# Patient Record
Sex: Female | Born: 1955 | Race: Black or African American | Hispanic: No | Marital: Married | Smoking: Never smoker
Health system: Southern US, Community
[De-identification: ages and names within clinical notes are randomized; demographics above are authoritative.]

## PROBLEM LIST (undated history)

## (undated) DIAGNOSIS — R7303 Prediabetes: Secondary | ICD-10-CM

## (undated) DIAGNOSIS — M1990 Unspecified osteoarthritis, unspecified site: Secondary | ICD-10-CM

## (undated) DIAGNOSIS — I10 Essential (primary) hypertension: Secondary | ICD-10-CM

## (undated) HISTORY — DX: Essential (primary) hypertension: I10

## (undated) HISTORY — DX: Unspecified osteoarthritis, unspecified site: M19.90

---

## 1997-09-30 ENCOUNTER — Other Ambulatory Visit: Admission: RE | Admit: 1997-09-30 | Discharge: 1997-09-30 | Payer: Self-pay

## 1998-04-03 ENCOUNTER — Ambulatory Visit (HOSPITAL_COMMUNITY): Admission: RE | Admit: 1998-04-03 | Discharge: 1998-04-03 | Payer: Self-pay

## 1999-06-05 ENCOUNTER — Other Ambulatory Visit: Admission: RE | Admit: 1999-06-05 | Discharge: 1999-06-05 | Payer: Self-pay | Admitting: Family Medicine

## 2007-07-20 HISTORY — PX: COLONOSCOPY: SHX174

## 2007-07-20 HISTORY — PX: ESOPHAGOGASTRODUODENOSCOPY: SHX1529

## 2007-08-13 ENCOUNTER — Ambulatory Visit: Payer: Self-pay | Admitting: Gastroenterology

## 2007-08-13 DIAGNOSIS — R198 Other specified symptoms and signs involving the digestive system and abdomen: Secondary | ICD-10-CM

## 2007-08-13 DIAGNOSIS — R1031 Right lower quadrant pain: Secondary | ICD-10-CM

## 2007-08-13 DIAGNOSIS — R109 Unspecified abdominal pain: Secondary | ICD-10-CM

## 2007-08-13 DIAGNOSIS — I10 Essential (primary) hypertension: Secondary | ICD-10-CM | POA: Insufficient documentation

## 2007-08-17 ENCOUNTER — Encounter: Payer: Self-pay | Admitting: Gastroenterology

## 2007-08-17 ENCOUNTER — Ambulatory Visit: Payer: Self-pay | Admitting: Gastroenterology

## 2007-08-18 ENCOUNTER — Encounter: Payer: Self-pay | Admitting: Gastroenterology

## 2007-08-19 ENCOUNTER — Encounter (INDEPENDENT_AMBULATORY_CARE_PROVIDER_SITE_OTHER): Payer: Self-pay

## 2007-08-19 ENCOUNTER — Encounter: Payer: Self-pay | Admitting: Gastroenterology

## 2007-08-19 ENCOUNTER — Telehealth (INDEPENDENT_AMBULATORY_CARE_PROVIDER_SITE_OTHER): Payer: Self-pay

## 2010-07-20 ENCOUNTER — Inpatient Hospital Stay (HOSPITAL_COMMUNITY): Payer: BC Managed Care – PPO

## 2010-07-20 ENCOUNTER — Inpatient Hospital Stay (HOSPITAL_COMMUNITY)
Admission: EM | Admit: 2010-07-20 | Discharge: 2010-07-24 | DRG: 277 | Disposition: A | Discharge status: Home Health | Payer: BC Managed Care – PPO | Attending: Internal Medicine | Admitting: Internal Medicine

## 2010-07-20 DIAGNOSIS — R7309 Other abnormal glucose: Secondary | ICD-10-CM | POA: Diagnosis present

## 2010-07-20 DIAGNOSIS — Z6841 Body Mass Index (BMI) 40.0 and over, adult: Secondary | ICD-10-CM

## 2010-07-20 DIAGNOSIS — E876 Hypokalemia: Secondary | ICD-10-CM | POA: Diagnosis present

## 2010-07-20 DIAGNOSIS — L02419 Cutaneous abscess of limb, unspecified: Principal | ICD-10-CM | POA: Diagnosis present

## 2010-07-20 NOTE — H&P (Signed)
Andrea Eaton, OAKEY                 ACCOUNT NO.:  [id number]  MEDICAL RECORD NO.:  [id number]           PATIENT TYPE:  E  LOCATION:  [facility]                          FACILITY:  [facility]  PHYSICIAN:  Wilson Singer, M.D.DATE OF BIRTH:  03-05-1955  DATE OF ADMISSION:  07/20/2010 DATE OF DISCHARGE:  LH                             HISTORY & PHYSICAL   CHIEF COMPLAINT:  Right leg swelling and pain.  HISTORY OF PRESENT ILLNESS:  This is a very pleasant 55 year old African American lady presents with a 3-day history of swelling and pain in the right thigh.  The area on the right thigh became red and angry looking and she went to see her primary care physician who prescribed doxycycline.  Unfortunately, outpatient oral medications has not helped her and last night she had a high fever of 103 degrees Fahrenheit.  She now comes with the above complaints.  The patient is not diabetic.  She does have a family history of diabetes however.  She does not know how she got the skin problem, but there is a Band-Aid on the right mid thigh where she appears to have got some kind of "boil."  PAST MEDICAL HISTORY:  No serious illnesses.  She does have obesity.  MEDICATIONS:  Doxycycline 100 mg b.i.d. as an outpatient.  ALLERGIES:  None.  SOCIAL HISTORY:  The patient has been married for 33 years.  She does not smoke, does not drink alcohol.  She is a retired Psychologist, forensic.  FAMILY HISTORY:  Her mother had diabetes.  REVIEW OF SYSTEMS:  Apart from the symptoms mentioned above, there are no other symptoms referable to all systems reviewed.  PHYSICAL EXAMINATION:  VITAL SIGNS:  Temperature 97.9, blood pressure 91/67, pulse 80, respiratory rate 14, saturation 100% on room air. GENERAL:  She looks systemically well.  She is not toxic.  Despite the low blood pressure, she is not in shock clinically. HEART:  Sounds are present and normal. LUNGS:  Fields are clear. ABDOMEN:  Soft and nontender with  no hepatosplenomegaly.  She does have a rather obese abdomen and it is difficult to feel for any masses. NEUROLOGIC:  She is alert and oriented without any focal neurologic signs. SKIN:  She clearly has right thigh cellulitis and I have demarcated this on her right side.  It encompasses a very large area as her legs are also large.  She weighs 252 pounds.  INVESTIGATIONS:  Lab work taken at the primary care physician's office show a hemoglobin of 12.2, white blood cell count 17.3, platelets 190. Sodium 137, potassium 3.6, bicarbonate 23, glucose 109, BUN 15, creatinine 1.09, total bilirubin 3.2, which is indirect.  Liver enzymes essentially normal.  Also, a wound culture has been taken and there is no growth so far.  PROBLEM LIST: 1. Right leg cellulitis. 2. Morbid obesity. 3. Hyperglycemia.  PLAN: 1. Admit. 2. Intravenous antibiotics. 3. Check hemoglobin A1c as she is clearly high risk for diabetes.  Further recommendations will depend on the patient's hospital progress.     Wilson Singer, M.D.     NCG/MEDQ  D:  07/20/2010  T:  07/20/2010  Job:  [id number]  cc:   [facility]  Electronically Signed by Lilly Cove M.D. on 07/20/2010 05:11:25 PM

## 2010-07-21 ENCOUNTER — Inpatient Hospital Stay (HOSPITAL_COMMUNITY): Payer: BC Managed Care – PPO

## 2010-07-21 LAB — HEMOGLOBIN A1C
Hgb A1c MFr Bld: 5.6 % (ref <5.7)
Mean Plasma Glucose: 114 mg/dL (ref <117)

## 2010-07-21 LAB — DIFFERENTIAL
Basophils Absolute: 0 10*3/uL (ref 0.0–0.1)
Basophils Relative: 0 % (ref 0–1)
Eosinophils Absolute: 0.2 10*3/uL (ref 0.0–0.7)
Eosinophils Relative: 1 % (ref 0–5)
Lymphocytes Relative: 11 % — ABNORMAL LOW (ref 12–46)
Lymphs Abs: 1.4 10*3/uL (ref 0.7–4.0)
Monocytes Absolute: 0.8 10*3/uL (ref 0.1–1.0)
Monocytes Relative: 6 % (ref 3–12)
Neutro Abs: 11.3 10*3/uL — ABNORMAL HIGH (ref 1.7–7.7)
Neutrophils Relative %: 83 % — ABNORMAL HIGH (ref 43–77)

## 2010-07-21 LAB — COMPREHENSIVE METABOLIC PANEL
ALT: 16 U/L (ref 0–35)
Calcium: 9.1 mg/dL (ref 8.4–10.5)
Creatinine, Ser: 0.98 mg/dL (ref 0.4–1.2)
Glucose, Bld: 103 mg/dL — ABNORMAL HIGH (ref 70–99)
Sodium: 138 mEq/L (ref 135–145)
Total Protein: 6 g/dL (ref 6.0–8.3)

## 2010-07-21 LAB — CBC
HCT: 30.2 % — ABNORMAL LOW (ref 36.0–46.0)
MCH: 27.8 pg (ref 26.0–34.0)
MCHC: 34.1 g/dL (ref 30.0–36.0)
RDW: 14 % (ref 11.5–15.5)

## 2010-07-21 NOTE — Group Therapy Note (Signed)
  NAMESTEVI, Andrea Eaton                 ACCOUNT NO.:  [id number]  MEDICAL RECORD NO.:  [id number]           PATIENT TYPE:  I  LOCATION:  A319                          FACILITY:  [facility]  PHYSICIAN:  Wilson Singer, M.D.DATE OF BIRTH:  Jul 05, 1955  DATE OF PROCEDURE:  07/21/2010 DATE OF DISCHARGE:                                PROGRESS NOTE   This lady was admitted yesterday with right thigh cellulitis.  She really has not improved since yesterday despite being on intravenous antibiotics.  She still has pain in that right leg.  PHYSICAL EXAMINATION:  VITAL SIGNS:  Temperature 98.4, blood pressure 101/70, pulse 74, saturation 99% on room air. EXTREMITIES:  Examination of the right leg shows cellulitis unchanged from the demarcated line by black ink that I had made yesterday.  In fact I feel there possibly might be an abscess in the right thigh based on a sensation of a fullness and a mass in the mid right thigh.  INVESTIGATIONS:  Sodium 138, potassium 2.8, bicarbonate 22, BUN 16, creatinine was 0.98, albumin reduced at 2.1.  Hemoglobin 10.3, white blood cell count down to 13.7, platelets 210.  IMPRESSION: 1. Right leg cellulitis. 2. Morbid obesity. 3. Hyperglycemia. 4. Hypokalemia.  PLAN: 1. Replete potassium. 2. Continue antibiotics. 3. We will get a CT scan of the right leg to see if there is an     abscess there which would be amenable for surgical intervention by     incision and drainage.     Wilson Singer, M.D.     NCG/MEDQ  D:  07/21/2010  T:  07/21/2010  Job:  [id number]  Electronically Signed by Lilly Cove M.D. on 07/21/2010 02:16:21 PM

## 2010-07-22 ENCOUNTER — Inpatient Hospital Stay (HOSPITAL_COMMUNITY): Payer: BC Managed Care – PPO

## 2010-07-22 LAB — CBC
HCT: 29.8 % — ABNORMAL LOW (ref 36.0–46.0)
Hemoglobin: 10.2 g/dL — ABNORMAL LOW (ref 12.0–15.0)
RBC: 3.63 MIL/uL — ABNORMAL LOW (ref 3.87–5.11)
WBC: 12.8 10*3/uL — ABNORMAL HIGH (ref 4.0–10.5)

## 2010-07-22 LAB — BASIC METABOLIC PANEL
CO2: 28 mEq/L (ref 19–32)
Chloride: 104 mEq/L (ref 96–112)
GFR calc Af Amer: 57 mL/min — ABNORMAL LOW (ref >60)
Glucose, Bld: 120 mg/dL — ABNORMAL HIGH (ref 70–99)
Potassium: 3.2 mEq/L — ABNORMAL LOW (ref 3.5–5.1)
Sodium: 137 mEq/L (ref 135–145)

## 2010-07-22 LAB — DIFFERENTIAL
Basophils Absolute: 0 10*3/uL (ref 0.0–0.1)
Basophils Relative: 0 % (ref 0–1)
Lymphocytes Relative: 15 % (ref 12–46)
Monocytes Absolute: 0.8 10*3/uL (ref 0.1–1.0)
Neutro Abs: 9.9 10*3/uL — ABNORMAL HIGH (ref 1.7–7.7)
Neutrophils Relative %: 77 % (ref 43–77)

## 2010-07-23 LAB — CBC
HCT: 30.1 % — ABNORMAL LOW (ref 36.0–46.0)
Hemoglobin: 10.1 g/dL — ABNORMAL LOW (ref 12.0–15.0)
MCH: 27.8 pg (ref 26.0–34.0)
MCHC: 33.6 g/dL (ref 30.0–36.0)
RBC: 3.63 MIL/uL — ABNORMAL LOW (ref 3.87–5.11)

## 2010-07-23 LAB — BASIC METABOLIC PANEL
BUN: 19 mg/dL (ref 6–23)
Creatinine, Ser: 1.29 mg/dL — ABNORMAL HIGH (ref 0.4–1.2)
GFR calc non Af Amer: 43 mL/min — ABNORMAL LOW (ref >60)
Potassium: 3.5 mEq/L (ref 3.5–5.1)

## 2010-07-23 LAB — DIFFERENTIAL
Basophils Relative: 1 % (ref 0–1)
Lymphocytes Relative: 17 % (ref 12–46)
Monocytes Absolute: 0.7 10*3/uL (ref 0.1–1.0)
Monocytes Relative: 7 % (ref 3–12)
Neutro Abs: 7.5 10*3/uL (ref 1.7–7.7)
Neutrophils Relative %: 73 % (ref 43–77)

## 2010-07-23 NOTE — Group Therapy Note (Signed)
  [name], Eaton                 ACCOUNT NO.:  [id number]  MEDICAL RECORD NO.:  [id number]  LOCATION:                                 FACILITY:  PHYSICIAN:  Wilson Singer, M.D.DATE OF BIRTH:  20-Nov-1955  DATE OF PROCEDURE:  07/22/2010 DATE OF DISCHARGE:                                PROGRESS NOTE   This lady is improving rather slowly.  Overall, she says she thinks her leg is better and less tender.  A CT scan of the right thigh yesterday was rather inconclusive.  The reason was that this was a suboptimal evaluation due to her morbid obesity and the suggestion was there was soft tissue infiltration compatible with cellulitis or edema but there was no clear abscess identified.  On physical examination today, temperature 98.8, blood pressure 111/73, pulse 77, saturation 99% on room air.  She does look systemically well but the cellulitis persists and the line that I demarcated with a black marker shows that the infection has really not improved.  Furthermore, there is still that fullness of quite a large area which may well be a mass of some sort of an abscess.  That area is somewhat tender also.  On investigations, hemoglobin 10.2, white blood cell coming down to 12.8, platelets 210.  Sodium 137, potassium 3.2, bicarbonate 28, BUN 15, creatinine 1.19, hemoglobin A1c was actually 5.6%, so she is probably glucose intolerant rather than frankly diabetic at this point.  IMPRESSION: 1. Right thigh cellulitis with possible abscess. 2. Morbid obesity. 3. Glucose intolerance. 4. Hypokalemia.  PLAN: 1. Replete potassium. 2. Ultrasound of the right thigh as I clinically still worry about an     abscess here. 3. Continue antibiotics.     Wilson Singer, M.D.     NCG/MEDQ  D:  07/22/2010  T:  07/22/2010  Job:  [id number]  Electronically Signed by Andrea Eaton M.D. on 07/23/2010 12:06:18 PM

## 2010-07-23 NOTE — Group Therapy Note (Signed)
  NAMETEPHANIE, Andrea Eaton                 ACCOUNT NO.:  [id number]  MEDICAL RECORD NO.:  [id number]  LOCATION:  A[facility]                          FACILITY:  [facility]  PHYSICIAN:  Wilson Singer, M.D.DATE OF BIRTH:  January 15, 1956  DATE OF PROCEDURE: DATE OF DISCHARGE:                                PROGRESS NOTE   This lady somewhat feels better, but still continues to have some pain in the right thigh.  She did have an ultrasound of the right thigh yesterday and there is a clear fluid collection which measures 5 x 1.9 x 5.5 cm and this is either a abscess or a possible hematoma.  PHYSICAL EXAMINATION:  VITAL SIGNS:  Temperature 98, blood pressure 101/66, pulse 57, saturation 98% on room air. GENERAL:  She looks systemically well otherwise and examination of the right thigh continues to show the cellulitis which really has not improved from the demarcated line I made as well as the fullness and the abscess that I believe I feel on the right thigh.  INVESTIGATIONS:  Sodium 138, potassium 3.5, bicarbonate 28, BUN 19, creatinine slightly raised at 1.29.  Hemoglobin 10.1, white blood cell count coming down again to 10.2, platelets 241.  IMPRESSION: 1. Right thigh cellulitis and possible abscess. 2. Morbid obesity. 3. Glucose intolerance.  PLAN: 1. Continue intravenous antibiotics. 2. Surgical consultation today.     Wilson Singer, M.D.     NCG/MEDQ  D:  07/23/2010  T:  07/23/2010  Job:  [id number]  Electronically Signed by Lilly Cove M.D. on 07/23/2010 12:06:23 PM

## 2010-07-24 LAB — CBC
HCT: 30.8 % — ABNORMAL LOW (ref 36.0–46.0)
MCH: 27.5 pg (ref 26.0–34.0)
MCHC: 33.1 g/dL (ref 30.0–36.0)
MCV: 83 fL (ref 78.0–100.0)
RDW: 14.8 % (ref 11.5–15.5)

## 2010-07-24 LAB — PROTIME-INR: Prothrombin Time: 15.1 seconds (ref 11.6–15.2)

## 2010-07-24 LAB — DIFFERENTIAL
Basophils Relative: 1 % (ref 0–1)
Eosinophils Absolute: 0.2 10*3/uL (ref 0.0–0.7)
Eosinophils Relative: 2 % (ref 0–5)
Monocytes Absolute: 0.8 10*3/uL (ref 0.1–1.0)
Monocytes Relative: 7 % (ref 3–12)
Neutrophils Relative %: 71 % (ref 43–77)

## 2010-07-24 LAB — COMPREHENSIVE METABOLIC PANEL
BUN: 16 mg/dL (ref 6–23)
Calcium: 9.6 mg/dL (ref 8.4–10.5)
Glucose, Bld: 94 mg/dL (ref 70–99)
Total Protein: 6.2 g/dL (ref 6.0–8.3)

## 2010-07-24 LAB — VANCOMYCIN, TROUGH: Vancomycin Tr: 23.6 ug/mL — ABNORMAL HIGH (ref 10.0–20.0)

## 2010-07-24 NOTE — Discharge Summary (Signed)
Andrea Eaton, MONTINI                 ACCOUNT NO.:  [id number]  MEDICAL RECORD NO.:  [id number]  LOCATION:  A[facility]                          FACILITY:  [facility]  PHYSICIAN:  Wilson Singer, M.D.DATE OF BIRTH:  October 03, 1955  DATE OF ADMISSION:  07/20/2010 DATE OF DISCHARGE:  LH                              DISCHARGE SUMMARY   FINAL DISCHARGE DIAGNOSIS:  Right thigh cellulitis with a draining abscess.  CONDITION ON DISCHARGE:  Stable.  MEDICATIONS ON DISCHARGE: 1. Levaquin 750 mg daily for 2 weeks. 2. Oxycodone 5 mg q.i.d. p.r.n. for pain. 3. Ibuprofen 800 mg every 4 h. p.r.n. 4. Tylenol 500 mg 2 capsules every 4-6 h. 5. Stop taking doxycycline (unable to tolerate).  HISTORY:  This very pleasant 55 year old lady was admitted with swelling of the right leg affecting down to the knee and including the thigh associated with a fever.  Please see initial history and physical examination done by Dr. Lilly Cove.  HOSPITAL PROGRESS:  The patient was admitted and started on intravenous antibiotics with Zosyn and vancomycin.  She underwent CT scan of her right thigh, but unfortunately this was rather inconclusive due to her obese body habitus.  There was a comment made about soft tissue infiltration compatible with cellulitis or edema in the appropriate clinical setting.  Due to this, she then underwent an ultrasound of the same area which confirmed complex area within the anterior right thigh and this was concerning for either abscess or hematoma.  The patient was seen by Dr. Franky Macho surgery who was able to exude pus from this area.  There was a wound entry site from which pus was draining.  He felt that this would be sufficient and that would drain further pus.  He is recommended that she will be on antibiotics for further 2 weeks and that he will review her in his office.  The patient is not technically diabetic, but she is glucose intolerant with a hemoglobin A1c of 5.6% and a  fasting glucose of 120.  Today, she feels somewhat better.  She has not had any fevers for the last 36 hours or so.  There is still some tenderness in the right thigh area.  PHYSICAL EXAMINATION:  Temperature 98.4, blood pressure 116/76, pulse 65 and saturation 99%.  Examination of the right leg shows some redness consistent with the cellulitis, but it seems to be receding from the demarcation line made by a Black marker by myself.  Therefore, I think she is improving.  Otherwise, she is hemodynamically stable and lung fields are clear.  INVESTIGATIONS:  Hemoglobin 10.2, white blood cell count 11.2, platelets 252.  Sodium 140, potassium 3.5, bicarbonate 26, BUN 16, creatinine 1.21, albumin 2.2.  We do not have any culture results present at the moment.  DISPOSITION:  I think the patient is stable to be discharged, although she will need close [facility]up [facility] with [facility] and she will need to take 2 weeks of antibiotics.  She will follow up with Dr. Franky Macho in the next week to make sure that her right leg is improving.     Wilson Singer, M.D.  NCG/MEDQ  D:  07/24/2010  T:  07/24/2010  Job:  [id number]  cc:   [facility]  Electronically Signed by Lilly Cove M.D. on 07/24/2010 01:26:31 PM

## 2012-01-14 ENCOUNTER — Encounter: Payer: Self-pay | Admitting: Family Medicine

## 2012-01-14 ENCOUNTER — Ambulatory Visit (INDEPENDENT_AMBULATORY_CARE_PROVIDER_SITE_OTHER): Payer: BC Managed Care – PPO | Admitting: Family Medicine

## 2012-01-14 ENCOUNTER — Other Ambulatory Visit: Payer: Self-pay | Admitting: Family Medicine

## 2012-01-14 VITALS — BP 136/84 | HR 70 | Resp 18 | Ht 60.75 in | Wt 248.0 lb

## 2012-01-14 DIAGNOSIS — M1990 Unspecified osteoarthritis, unspecified site: Secondary | ICD-10-CM

## 2012-01-14 DIAGNOSIS — G4700 Insomnia, unspecified: Secondary | ICD-10-CM

## 2012-01-14 DIAGNOSIS — Z1239 Encounter for other screening for malignant neoplasm of breast: Secondary | ICD-10-CM

## 2012-01-14 DIAGNOSIS — I10 Essential (primary) hypertension: Secondary | ICD-10-CM

## 2012-01-14 DIAGNOSIS — E669 Obesity, unspecified: Secondary | ICD-10-CM | POA: Insufficient documentation

## 2012-01-14 LAB — COMPREHENSIVE METABOLIC PANEL
AST: 16 U/L (ref 0–37)
Albumin: 4.3 g/dL (ref 3.5–5.2)
BUN: 12 mg/dL (ref 6–23)
Calcium: 9.6 mg/dL (ref 8.4–10.5)
Chloride: 105 mEq/L (ref 96–112)
Glucose, Bld: 67 mg/dL — ABNORMAL LOW (ref 70–99)
Potassium: 4 mEq/L (ref 3.5–5.3)
Sodium: 143 mEq/L (ref 135–145)
Total Protein: 8 g/dL (ref 6.0–8.3)

## 2012-01-14 LAB — TSH: TSH: 2.424 u[IU]/mL (ref 0.350–4.500)

## 2012-01-14 LAB — CBC
HCT: 42.8 % (ref 36.0–46.0)
Hemoglobin: 14.5 g/dL (ref 12.0–15.0)
RDW: 15.4 % (ref 11.5–15.5)
WBC: 4.5 10*3/uL (ref 4.0–10.5)

## 2012-01-14 LAB — LIPID PANEL: HDL: 67 mg/dL (ref >39)

## 2012-01-14 NOTE — Progress Notes (Signed)
  Subjective:    Patient ID: Andrea Eaton, female    DOB: 01/05/1956, 56 y.o.   MRN: [id number]  HPI Patient here to establish care. Previous PCP [facility]- Dr. Cynda Acres- Dr. Lajoyce Corners No medications history reviewed she is overdue for Pap smear and mammogram. She's history of high blood pressure she took her meds herself off her medications. She denies any history of diabetes mellitus coronary artery disease or hyperlipidemia. 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 Lajoyce Corners and had steroid injections into her knees however did not followup. She is adverse to taking medications therefore rarely uses and Aleve. She's a history of colon polyps and is due for colonoscopy per report. She has difficulty sleeping because of previous job but she declines any medications for this No exercise   Review of Systems  GEN- denies fatigue, fever, weight loss,weakness, recent illness HEENT- denies eye drainage, change in vision, nasal discharge, CVS- denies chest pain, palpitations RESP- denies SOB, cough, wheeze ABD- denies N/V, change in stools, abd pain GU- denies dysuria, hematuria, dribbling, incontinence MSK- denies joint pain, muscle aches, injury Neuro- denies headache, dizziness, syncope, seizure activity      Objective:   Physical Exam GEN- NAD, alert and oriented x3, obese  HEENT- PERRL, EOMI, non injected sclera, pink conjunctiva, MMM, oropharynx clear Neck- Supple,  CVS- RRR, no murmur RESP-CTAB EXT-+ pedal edema Pulses- Radial, DP- 2+ Psych-normal affect and Mood       Assessment & Plan:

## 2012-01-14 NOTE — Assessment & Plan Note (Signed)
Discussed need for diet and exercise, will also help her OA Discussed water aerobics

## 2012-01-14 NOTE — Assessment & Plan Note (Signed)
No current medications Motrin blood pressure. She will have fasting labs before next visit. Note our computer system was done at that time she was given hand written prescription

## 2012-01-14 NOTE — Assessment & Plan Note (Signed)
Adverse to meds, trial of topical aspercreme, aleve for severe pain Need for weight loss, activity

## 2012-01-14 NOTE — Assessment & Plan Note (Signed)
Declines meds, discussed possible trial of melatonin, good sleep hygiene

## 2012-01-23 ENCOUNTER — Ambulatory Visit (HOSPITAL_COMMUNITY): Payer: BC Managed Care – PPO

## 2012-01-23 ENCOUNTER — Ambulatory Visit (HOSPITAL_COMMUNITY)
Admission: RE | Admit: 2012-01-23 | Discharge: 2012-01-23 | Disposition: A | Discharge status: Home / Self Care | Payer: BC Managed Care – PPO | Source: Ambulatory Visit | Attending: Family Medicine | Admitting: Family Medicine

## 2012-01-23 DIAGNOSIS — Z1231 Encounter for screening mammogram for malignant neoplasm of breast: Secondary | ICD-10-CM | POA: Insufficient documentation

## 2012-01-23 DIAGNOSIS — Z1239 Encounter for other screening for malignant neoplasm of breast: Secondary | ICD-10-CM

## 2012-03-12 ENCOUNTER — Encounter: Payer: BC Managed Care – PPO | Admitting: Family Medicine

## 2012-03-17 ENCOUNTER — Encounter: Payer: Self-pay | Admitting: Family Medicine

## 2012-03-17 ENCOUNTER — Ambulatory Visit (INDEPENDENT_AMBULATORY_CARE_PROVIDER_SITE_OTHER): Payer: BC Managed Care – PPO | Admitting: Family Medicine

## 2012-03-17 VITALS — BP 126/78 | HR 64 | Resp 18 | Ht 60.75 in | Wt 253.1 lb

## 2012-03-17 DIAGNOSIS — R609 Edema, unspecified: Secondary | ICD-10-CM

## 2012-03-17 DIAGNOSIS — Z23 Encounter for immunization: Secondary | ICD-10-CM

## 2012-03-17 DIAGNOSIS — M1990 Unspecified osteoarthritis, unspecified site: Secondary | ICD-10-CM

## 2012-03-17 DIAGNOSIS — H547 Unspecified visual loss: Secondary | ICD-10-CM

## 2012-03-17 DIAGNOSIS — Z1211 Encounter for screening for malignant neoplasm of colon: Secondary | ICD-10-CM

## 2012-03-17 DIAGNOSIS — I10 Essential (primary) hypertension: Secondary | ICD-10-CM

## 2012-03-17 DIAGNOSIS — R600 Localized edema: Secondary | ICD-10-CM

## 2012-03-17 DIAGNOSIS — Z01419 Encounter for gynecological examination (general) (routine) without abnormal findings: Secondary | ICD-10-CM

## 2012-03-17 MED ORDER — HYDROCHLOROTHIAZIDE 12.5 MG PO CAPS: 12.5000 mg | ORAL_CAPSULE | Freq: Every day | ORAL | Status: DC

## 2012-03-17 NOTE — Progress Notes (Signed)
  Subjective:    Patient ID: Andrea Eaton, female    DOB: Jan 04, 1956, 57 y.o.   MRN: [id number]  HPI Patient here for GYN exam she has no specific concerns. Labs were reviewed at visit She declined flu shot Mammogram within normal limits She continues to get some lower leg swelling occasionally she has a sharp pain that runs up her right shin No vaginal bleeding  She occasionally feels a pain beneath the right side on her stomach  Review of Systems  GEN- denies fatigue, fever, weight loss,weakness, recent illness HEENT- denies eye drainage, change in vision, nasal discharge, CVS- denies chest pain, palpitations RESP- denies SOB, cough, wheeze ABD- denies N/V, change in stools, abd pain GU- denies dysuria, hematuria, dribbling, incontinence MSK- denies joint pain, muscle aches, injury Neuro- denies headache, dizziness, syncope, seizure activity      Objective:   Physical Exam  GEN- NAD, alert and oriented, Neck- supple, no thyromegaly  Breast- normal symmetry, no nipple inversion,no nipple drainage, no nodules or lumps felt Nodes- no axillary nodes CVS-RRR,no murmur RESP-CTAB ABD-NABS,soft,NT,ND  GU- normal external genitalia, vaginal mucosa pink and moist, cervix visualized no growth, no blood form os, thin clear discharge, no CMT, no ovarian masses, uterus normal size- difficult to palpate with habitus FOBT- negative, normal rectal tone Ext- + pitting edema R >L        Assessment & Plan:    GYN exam- PAP Smear done, cultures taken , mammogram UTD, TDAP given

## 2012-03-17 NOTE — Patient Instructions (Addendum)
I recommend eye visit once a year I recommend dental visit every 6 months Goal is to  Exercise 30 minutes 5 days a week We will send a letter with lab results  Work your your weight, eat the same healthy foods as your husband You need to schedule appointment with [facility] or try [employer] [facility] Start low dose water pill F/U 4 months

## 2012-03-17 NOTE — Assessment & Plan Note (Signed)
She has an eye doctor, advised to make f/u appt

## 2012-03-17 NOTE — Assessment & Plan Note (Signed)
OTC meds as needed, reviewed old MRI and history

## 2012-03-17 NOTE — Assessment & Plan Note (Signed)
BP looks good she is getting some leg swelling I will start her on low dose HCTZ

## 2012-03-17 NOTE — Assessment & Plan Note (Signed)
Discussed weight and diet in detail, she does not appear motivated to change

## 2012-03-17 NOTE — Assessment & Plan Note (Signed)
I believe this is due to her morbid obesity, start low dose HCTZ, elevate feet, increase activity

## 2012-03-18 ENCOUNTER — Other Ambulatory Visit (HOSPITAL_COMMUNITY)
Admission: RE | Admit: 2012-03-18 | Discharge: 2012-03-18 | Disposition: A | Discharge status: Home / Self Care | Payer: BC Managed Care – PPO | Source: Ambulatory Visit | Attending: Family Medicine | Admitting: Family Medicine

## 2012-03-18 DIAGNOSIS — Z01419 Encounter for gynecological examination (general) (routine) without abnormal findings: Secondary | ICD-10-CM | POA: Insufficient documentation

## 2012-03-18 DIAGNOSIS — N760 Acute vaginitis: Secondary | ICD-10-CM | POA: Insufficient documentation

## 2012-03-18 LAB — POC HEMOCCULT BLD/STL (OFFICE/1-CARD/DIAGNOSTIC): Fecal Occult Blood, POC: NEGATIVE

## 2012-03-18 NOTE — Addendum Note (Signed)
Addended by: Kandis Fantasia B on: 03/18/2012 10:16 AM   Modules accepted: Orders

## 2012-03-19 MED ORDER — METRONIDAZOLE 500 MG PO TABS: 500.0000 mg | ORAL_TABLET | Freq: Two times a day (BID) | ORAL | Status: AC

## 2012-03-19 NOTE — Addendum Note (Signed)
Addended by: Milinda Antis F on: 03/19/2012 12:09 PM   Modules accepted: Orders

## 2012-07-16 ENCOUNTER — Ambulatory Visit (INDEPENDENT_AMBULATORY_CARE_PROVIDER_SITE_OTHER): Payer: BC Managed Care – PPO | Admitting: Family Medicine

## 2012-07-16 ENCOUNTER — Encounter: Payer: Self-pay | Admitting: Family Medicine

## 2012-07-16 VITALS — BP 122/88 | HR 58 | Resp 15 | Wt 248.0 lb

## 2012-07-16 DIAGNOSIS — I10 Essential (primary) hypertension: Secondary | ICD-10-CM

## 2012-07-16 DIAGNOSIS — R0789 Other chest pain: Secondary | ICD-10-CM

## 2012-07-16 DIAGNOSIS — M1990 Unspecified osteoarthritis, unspecified site: Secondary | ICD-10-CM

## 2012-07-16 NOTE — Progress Notes (Signed)
  Subjective:    Patient ID: Andrea Eaton, female    DOB: 11-26-1955, 57 y.o.   MRN: [id number]  HPI  Patient here to followup chronic medical problems. She's not used the hydrochlorothiazide on a regular basis as she is fearful medications. She continues to have pains on and off in bilateral knees they often make a popping sensation. She denies any recent falls. She very rarely takes an over-the-counter acetaminophen tablet which does help. She also describes having episodes where she gets a strange sensation coming up her throat and chest she denies any chest pain shortness of breath diaphoresis the episodes will happen randomly she thought initially due to stress she has taken antacids and aspirin after the event he cannot remember if these helped. She did have a prolonged episode where she felt like the sensation was crawling up her neck which lasted about 10 minutes. She's never sought any medical care for this. She denies any nausea vomiting or heartburn.  Review of Systems   GEN- denies fatigue, fever, weight loss,weakness, recent illness HEENT- denies eye drainage, change in vision, nasal discharge, CVS- denies chest pain, palpitations RESP- denies SOB, cough, wheeze ABD- denies N/V, change in stools, abd pain GU- denies dysuria, hematuria, dribbling, incontinence MSK- + joint pain, muscle aches, injury Neuro- denies headache, dizziness, syncope, seizure activity      Objective:   Physical Exam GEN- NAD, alert and oriented x3, obese weight loss 5lbs HEENT- PERRL, EOMI, non injected sclera, pink conjunctiva, MMM, oropharynx clear CVS- RRR, no murmur RESP-CTAB ABD-NABS,soft,NT,ND MSK- Bilat knee- no effusion, Fair ROM,+ crepitus, liagements in tact  EXT- trace pedal edema Pulses- Radial, DP- 2+        Assessment & Plan:

## 2012-07-16 NOTE — Patient Instructions (Addendum)
Try the Aspercreme for your joints Continue HCTZ Womens One A day -- Calcium (1000mg ) and Vitamin D (800IU) F/U 6 months - Come Fasting to appointment

## 2012-07-17 NOTE — Assessment & Plan Note (Signed)
Advised Aspercreme or acetaminophen, very adverse to medication use We can also try Voltaren gel if OTC does not work

## 2012-07-17 NOTE — Assessment & Plan Note (Signed)
I can not quite get a grasp on her symptoms, does not sound entirely cardiac, ?GI related as she feels it into her throat, no recent episodes she will try to record her symptoms next time and see if antacid helps Anxiety is also a differential, she is dealing with a lot of stressor at home

## 2012-07-17 NOTE — Assessment & Plan Note (Signed)
Continue to encourage healthy eating and change in diet She has lost a few pounds

## 2012-07-17 NOTE — Assessment & Plan Note (Signed)
BP still stable, she is not using HCTZ on regular basis

## 2012-09-27 ENCOUNTER — Emergency Department (HOSPITAL_COMMUNITY): Payer: BC Managed Care – PPO

## 2012-09-27 ENCOUNTER — Encounter (HOSPITAL_COMMUNITY): Payer: Self-pay | Admitting: *Deleted

## 2012-09-27 ENCOUNTER — Observation Stay (HOSPITAL_COMMUNITY)
Admission: EM | Admit: 2012-09-27 | Discharge: 2012-09-27 | Disposition: A | Discharge status: Home / Self Care | Payer: BC Managed Care – PPO | Attending: Family Medicine | Admitting: Family Medicine

## 2012-09-27 DIAGNOSIS — M1990 Unspecified osteoarthritis, unspecified site: Secondary | ICD-10-CM | POA: Diagnosis present

## 2012-09-27 DIAGNOSIS — I10 Essential (primary) hypertension: Secondary | ICD-10-CM | POA: Insufficient documentation

## 2012-09-27 DIAGNOSIS — E66813 Obesity, class 3: Secondary | ICD-10-CM | POA: Diagnosis present

## 2012-09-27 DIAGNOSIS — R55 Syncope and collapse: Principal | ICD-10-CM | POA: Diagnosis present

## 2012-09-27 DIAGNOSIS — R600 Localized edema: Secondary | ICD-10-CM

## 2012-09-27 DIAGNOSIS — R609 Edema, unspecified: Secondary | ICD-10-CM

## 2012-09-27 LAB — CBC WITH DIFFERENTIAL/PLATELET
Basophils Relative: 0 % (ref 0–1)
Eosinophils Absolute: 0.1 10*3/uL (ref 0.0–0.7)
Lymphs Abs: 1.8 10*3/uL (ref 0.7–4.0)
MCH: 29.3 pg (ref 26.0–34.0)
Neutro Abs: 3.9 10*3/uL (ref 1.7–7.7)
Neutrophils Relative %: 64 % (ref 43–77)
Platelets: 263 10*3/uL (ref 150–400)
RBC: 4.84 MIL/uL (ref 3.87–5.11)
WBC: 6.1 10*3/uL (ref 4.0–10.5)

## 2012-09-27 LAB — COMPREHENSIVE METABOLIC PANEL
Albumin: 3.7 g/dL (ref 3.5–5.2)
BUN: 15 mg/dL (ref 6–23)
Calcium: 9.8 mg/dL (ref 8.4–10.5)
Chloride: 104 mEq/L (ref 96–112)
Creatinine, Ser: 0.86 mg/dL (ref 0.50–1.10)
Total Bilirubin: 0.9 mg/dL (ref 0.3–1.2)

## 2012-09-27 LAB — CBC
Hemoglobin: 13.9 g/dL (ref 12.0–15.0)
RBC: 4.8 MIL/uL (ref 3.87–5.11)
WBC: 6.3 10*3/uL (ref 4.0–10.5)

## 2012-09-27 LAB — TSH: TSH: 2.958 u[IU]/mL (ref 0.350–4.500)

## 2012-09-27 LAB — BASIC METABOLIC PANEL
GFR calc Af Amer: >90 mL/min (ref >90)
GFR calc non Af Amer: >90 mL/min (ref >90)
Glucose, Bld: 85 mg/dL (ref 70–99)
Potassium: 3.3 mEq/L — ABNORMAL LOW (ref 3.5–5.1)
Sodium: 141 mEq/L (ref 135–145)

## 2012-09-27 LAB — TROPONIN I
Troponin I: <0.3 ng/mL (ref <0.30)
Troponin I: <0.3 ng/mL (ref <0.30)

## 2012-09-27 LAB — MAGNESIUM: Magnesium: 1.9 mg/dL (ref 1.5–2.5)

## 2012-09-27 LAB — HEMOGLOBIN A1C: Hgb A1c MFr Bld: 5.6 % (ref <5.7)

## 2012-09-27 MED ORDER — ENOXAPARIN SODIUM 60 MG/0.6ML ~~LOC~~ SOLN: 55.0000 mg | SUBCUTANEOUS | Status: DC

## 2012-09-27 MED ORDER — ACETAMINOPHEN 325 MG PO TABS: 650.0000 mg | ORAL_TABLET | ORAL | Status: DC | PRN

## 2012-09-27 MED ORDER — SODIUM CHLORIDE 0.9 % IJ SOLN: 3.0000 mL | Freq: Two times a day (BID) | INTRAMUSCULAR | Status: DC

## 2012-09-27 MED ORDER — POTASSIUM CHLORIDE CRYS ER 20 MEQ PO TBCR
40.0000 meq | EXTENDED_RELEASE_TABLET | Freq: Once | ORAL | Status: AC
  Administered 2012-09-27: 40 meq via ORAL
  Filled 2012-09-27: qty 2

## 2012-09-27 MED ORDER — BISACODYL 5 MG PO TBEC: 5.0000 mg | DELAYED_RELEASE_TABLET | Freq: Every day | ORAL | Status: DC | PRN

## 2012-09-27 MED ORDER — ONDANSETRON HCL 4 MG/2ML IJ SOLN: 4.0000 mg | INTRAMUSCULAR | Status: DC | PRN

## 2012-09-27 MED ORDER — ONDANSETRON HCL 4 MG/2ML IJ SOLN: 4.0000 mg | Freq: Three times a day (TID) | INTRAMUSCULAR | Status: DC | PRN

## 2012-09-27 MED ORDER — ASPIRIN EC 81 MG PO TBEC
81.0000 mg | DELAYED_RELEASE_TABLET | Freq: Every day | ORAL | Status: DC
  Administered 2012-09-27: 81 mg via ORAL
  Filled 2012-09-27: qty 1

## 2012-09-27 NOTE — ED Provider Notes (Signed)
CSN: [id number]     Arrival date & time 09/27/12  0005 History     First MD Initiated Contact with Patient 09/27/12 0008     Chief Complaint  Patient presents with  . Near Syncope    getting hair done and had loss of consciousness   HPI Andrea Eaton is a 57 y.o. female presenting with syncope. Patient had single syncopal episode this evening approximately 2230 this evening, patient had been sitting down for 5 minutes, said she had dizziness and then had syncopal episode denies nausea, tunnel vision, tingling, numbness.  She didn't have any shortness of breath or chest pain. She has a history of hypertension, no known coronary artery disease, no known arrhythmias. No history of CVA. She's not been sick recently, she denies any dizziness or lightheadedness on changing positions recently, she says she's been eating and drinking normally without vomiting or diarrhea. No abdominal pain.   Past Medical History  Diagnosis Date  . Hypertension   . Arthritis     Dr. Lajoyce Corners   History reviewed. No pertinent past surgical history. Family History  Problem Relation Age of Onset  . Hypertension Mother   . Diabetes Mother   . Hypertension Father   . Heart disease Father   . Diabetes Sister   . Hypertension Sister   . Hyperlipidemia Sister   . Heart disease Sister   . Hypertension Brother   . Diabetes Brother    History  Substance Use Topics  . Smoking status: Never Smoker   . Smokeless tobacco: Not on file  . Alcohol Use: No   OB History   Grav Para Term Preterm Abortions TAB SAB Ect Mult Living                 Review of Systems At least 10pt or greater review of systems completed and are negative except where specified in the HPI.  Allergies  Review of patient's allergies indicates no known allergies.  Home Medications   Current Outpatient Rx  Name  Route  Sig  Dispense  Refill  . hydrochlorothiazide (MICROZIDE) 12.5 MG capsule   Oral   Take 1 capsule (12.5 mg total) by mouth  daily.   30 capsule   3    BP 117/43  Pulse 66  Temp(Src) 97.7 F (36.5 C) (Oral)  Resp 20  Ht 5\' 2"  (1.575 m)  Wt 248 lb (112.492 kg)  BMI 45.35 kg/m2  SpO2 100% Physical Exam  Nursing notes reviewed.  Electronic medical record reviewed. VITAL SIGNS:   Filed Vitals:   09/27/12 0017 09/27/12 0017 09/27/12 0218 09/27/12 0246  BP: 138/83 139/76 117/56 139/84  Pulse: 72 70 76   Temp:    98.3 F (36.8 C)  TempSrc:    Oral  Resp:   20 20  Height:    5\' 2"  (1.575 m)  Weight:      SpO2:   98% 99%   CONSTITUTIONAL: Awake, oriented, appears non-toxic HENT: Atraumatic, normocephalic, oral mucosa pink and moist, airway patent. Nares patent without drainage. External ears normal. EYES: Conjunctiva clear, EOMI, PERRLA NECK: Trachea midline, non-tender, supple CARDIOVASCULAR: Normal heart rate, Normal rhythm, No murmurs, rubs, gallops PULMONARY/CHEST: Clear to auscultation, no rhonchi, wheezes, or rales. Symmetrical breath sounds. Non-tender. ABDOMINAL: Non-distended, soft, non-tender - no rebound or guarding.  BS normal. NEUROLOGIC: Non-focal, moving all four extremities, no gross sensory or motor deficits. EXTREMITIES: No clubbing, cyanosis, or edema SKIN: Warm, Dry, No erythema, No rash  ED Course  Procedures (including critical care time)  Date: 09/27/2012  Rate: 63  Rhythm: normal sinus rhythm  QRS Axis: normal  Intervals: normal  ST/T Wave abnormalities: normal  Conduction Disutrbances: none  Narrative Interpretation: unremarkable prior EKG for comparison  Labs Reviewed  COMPREHENSIVE METABOLIC PANEL - Abnormal; Notable for the following:    GFR calc non Af Amer 74 (*)    GFR calc Af Amer 85 (*)    All other components within normal limits  CBC WITH DIFFERENTIAL  TROPONIN I  MAGNESIUM   Dg Chest 2 View  09/27/2012   *RADIOLOGY REPORT*  Clinical Data: Shortness of breath and weakness; syncope.  CHEST - 2 VIEW  Comparison: Chest radiograph performed 07/20/2010   Findings: The lungs are well-aerated and clear.  There is no evidence of focal opacification, pleural effusion or pneumothorax.  The heart is normal in size; the mediastinal contour is within normal limits.  No acute osseous abnormalities are seen.  IMPRESSION: No acute cardiopulmonary process seen.   Original Report Authenticated By: Tonia Ghent, M.D.   Ct Head Wo Contrast  09/27/2012   *RADIOLOGY REPORT*  Clinical Data: Weakness; shortness of breath and headache. Syncope.  CT HEAD WITHOUT CONTRAST  Technique:  Contiguous axial images were obtained from the base of the skull through the vertex without contrast.  Comparison: None.  Findings: There is no evidence of acute infarction, mass lesion, or intra- or extra-axial hemorrhage on CT.  The posterior fossa, including the cerebellum, brainstem and fourth ventricle, is within normal limits.  The third and lateral ventricles, and basal ganglia are unremarkable in appearance.  The cerebral hemispheres are symmetric in appearance, with normal gray- white differentiation.  No mass effect or midline shift is seen.  There is no evidence of fracture; visualized osseous structures are unremarkable in appearance.  The visualized portions of the orbits are within normal limits.  The paranasal sinuses and mastoid air cells are well-aerated.  No significant soft tissue abnormalities are seen.  IMPRESSION: Unremarkable noncontrast CT of the head.   Original Report Authenticated By: Tonia Ghent, M.D.   1. Syncope   2. Hypertension     MDM  Patient presents with syncope-unremarkable workup, negative CT, patient is not orthostatic.  Situation surrounding syncope is concerning with this patient, no prior history, minimal prodrome, no change in orthostatics. Admit patient for syncope workup. Discussed with Dr. Orvan Falconer for admission to telemetry. Admitted stable condition.  Jones Skene, MD 09/27/12 1610

## 2012-09-27 NOTE — Progress Notes (Signed)
Patient received discharge instructions along with follow up appointments and prescriptions. Patient verbalized understanding of all instructions. Patient was escorted by staff via wheelchair to vehicle. Patient discharged to home in stable condition. 

## 2012-09-27 NOTE — H&P (Signed)
[facility] History and Physical  Andrea Eaton  ZOX:[id number]  DOB: Apr 20, 1955   DOA: 09/27/2012   PCP:   Milinda Antis, MD   Chief Complaint:  syncope   HPI: Andrea Eaton is a 57 y.o. female.   Obese African American lady with no significant past medical is who was having a here done by her daughter, when she suddenly became lightheaded and then blacked out. Her daughter eased her down onto the floor, and her report she was unresponsive for what seemed like about minutes. There was no seizure-like activity and when she came around she was fully oriented and aware of what had happened.  EMS was called and she was brought to the emergency room for further evaluation.  There was no associated chest pains nor palpitations; no previous history of syncope.  No abnormalities were found on physical examination or laboratory evaluation.   Patient reports she is prescribed a diuretic for leg edema for the past 5 , but has not been taking it regularly. This week she has been taking it more than usual on the advice her physician.  She is on a self-directed program for weight loss through dieting and has lost 8 pounds in the past 2 months She denies a history of hypertension Rewiew of Systems:   All systems negative except as marked bold or noted in the HPI;  Constitutional:    malaise, fever and chills. ;  Eyes:   eye pain, redness and discharge. ;  ENMT:   ear pain, hoarseness, nasal congestion, sinus pressure and sore throat. ;  Cardiovascular:    chest pain, palpitations, diaphoresis, dyspnea and peripheral edema.  Respiratory:   cough, hemoptysis, wheezing and stridor. ;  Gastrointestinal:  nausea, vomiting, diarrhea, constipation, abdominal pain, melena, blood in stool, hematemesis, jaundice and rectal bleeding. unusual weight loss..   Genitourinary:    frequency, dysuria, incontinence,flank pain and hematuria; Musculoskeletal:   back pain and neck pain.  swelling and trauma.;   Skin: .  pruritus, rash, abrasions, bruising and skin lesion.; ulcerations Neuro:    headache, lightheadedness and neck stiffness.  weakness, altered level of consciousness, altered mental status, extremity weakness, burning feet, involuntary movement, seizure   Psych:    anxiety, depression, insomnia, tearfulness, panic attacks, hallucinations, paranoia, suicidal or homicidal ideation    Past Medical History  Diagnosis Date  . Hypertension   . Arthritis     Dr. Lajoyce Corners    History reviewed. No pertinent past surgical history.  Medications:  HOME MEDS: Prior to Admission medications   Medication Sig Start Date End Date Taking? Authorizing Provider  hydrochlorothiazide (MICROZIDE) 12.5 MG capsule Take 1 capsule (12.5 mg total) by mouth daily. 03/17/12 03/17/13 Yes Salley Scarlet, MD     Allergies:  No Known Allergies  Social History:   reports that she has never smoked. She does not have any smokeless tobacco history on file. She reports that she does not drink alcohol or use illicit drugs.  Family History: Family History  Problem Relation Age of Onset  . Hypertension Mother   . Diabetes Mother   . Hypertension Father   . Heart disease Father   . Diabetes Sister   . Hypertension Sister   . Hyperlipidemia Sister   . Heart disease Sister   . Hypertension Brother   . Diabetes Brother      Physical Exam: Filed Vitals:   09/27/12 0009 09/27/12 0017 09/27/12 0017 09/27/12 0017  BP: 117/43 124/70 138/83 139/76  Pulse: 66 74 72 70  Temp: 97.7 F (36.5 C)     TempSrc: Oral     Resp: 20     Height: 5\' 2"  (1.575 m)     Weight: 112.492 kg (248 lb)     SpO2: 100%      Blood pressure 139/76, pulse 70, temperature 97.7 F (36.5 C), temperature source Oral, resp. rate 20, height 5\' 2"  (1.575 m), weight 112.492 kg (248 lb), SpO2 100.00%. Body mass index is 45.35 kg/(m^2).   GEN:  Pleasant  Obese African American lady  lying bed in no acute distress; cooperative with  exam PSYCH:  alert and oriented x4;   neither anxious nor depressed; affect is appropriate. HEENT: Mucous membranes pink and anicteric; PERRLA; EOM intact; no cervical lymphadenopathy nor thyromegaly or carotid bruit; no JVD; Breasts:: Not examined CHEST WALL: No tenderness CHEST: Normal respiration, clear to auscultation bilaterally HEART: Regular rate and rhythm; no murmurs rubs or gallops BACK:  no CVA tenderness ABDOMEN: Obese, soft non-tender; no masses, no organomegaly, normal abdominal bowel sounds; Rectal Exam: Not done EXTREMITIES:  age-appropriate arthropathy of the hands and knees; no edema; no ulcerations. Genitalia: not examined PULSES: 2+ and symmetric SKIN: Normal hydration no rash or ulceration CNS: Cranial nerves 2-12 grossly intact no focal lateralizing neurologic deficit   Labs on Admission:  Basic Metabolic Panel:  Recent Labs Lab 09/27/12 0058  NA 140  K 3.6  CL 104  CO2 26  GLUCOSE 93  BUN 15  CREATININE 0.86  CALCIUM 9.8  MG 1.9   Liver Function Tests:  Recent Labs Lab 09/27/12 0058  AST 17  ALT 14  ALKPHOS 76  BILITOT 0.9  PROT 8.1  ALBUMIN 3.7   No results found for this basename: LIPASE, AMYLASE,  in the last 168 hours No results found for this basename: AMMONIA,  in the last 168 hours CBC:  Recent Labs Lab 09/27/12 0058  WBC 6.1  NEUTROABS 3.9  HGB 14.2  HCT 41.5  MCV 85.7  PLT 263   Cardiac Enzymes:  Recent Labs Lab 09/27/12 0058  TROPONINI <0.30   BNP: No components found with this basename: POCBNP,  D-dimer: No components found with this basename: D-DIMER,  CBG: No results found for this basename: GLUCAP,  in the last 168 hours  Radiological Exams on Admission: Dg Chest 2 View  09/27/2012   *RADIOLOGY REPORT*  Clinical Data: Shortness of breath and weakness; syncope.  CHEST - 2 VIEW  Comparison: Chest radiograph performed 07/20/2010  Findings: The lungs are well-aerated and clear.  There is no evidence of focal  opacification, pleural effusion or pneumothorax.  The heart is normal in size; the mediastinal contour is within normal limits.  No acute osseous abnormalities are seen.  IMPRESSION: No acute cardiopulmonary process seen.   Original Report Authenticated By: Tonia Ghent, M.D.   Ct Head Wo Contrast  09/27/2012   *RADIOLOGY REPORT*  Clinical Data: Weakness; shortness of breath and headache. Syncope.  CT HEAD WITHOUT CONTRAST  Technique:  Contiguous axial images were obtained from the base of the skull through the vertex without contrast.  Comparison: None.  Findings: There is no evidence of acute infarction, mass lesion, or intra- or extra-axial hemorrhage on CT.  The posterior fossa, including the cerebellum, brainstem and fourth ventricle, is within normal limits.  The third and lateral ventricles, and basal ganglia are unremarkable in appearance.  The cerebral hemispheres are symmetric in appearance, with normal gray- white differentiation.  No mass  effect or midline shift is seen.  There is no evidence of fracture; visualized osseous structures are unremarkable in appearance.  The visualized portions of the orbits are within normal limits.  The paranasal sinuses and mastoid air cells are well-aerated.  No significant soft tissue abnormalities are seen.  IMPRESSION: Unremarkable noncontrast CT of the head.   Original Report Authenticated By: Tonia Ghent, M.D.    EKG: Independently reviewed. Normal sinus rhythm; no abnormalities    Assessment/Plan   vasovagal syncope   morbid obesity Benign leg edema   PLAN: Overnight observation on telemetry positive reinforcement for her current weight loss plan recommend compression stockings for leg edema and avoid diuretics especially in summer.  Other plans as per orders.  Code Status: Full code  Family Communication: Plans discuss with patient at bedside Disposition Plan: LikelyHome in the morning    Lotta Frankenfield Nocturnist [facility] Pager [phone]   09/27/2012, 2:09 AM

## 2012-09-27 NOTE — Discharge Summary (Signed)
Physician Discharge Summary  Andrea Eaton ZOX:[id number] DOB: 10-06-1955 DOA: 09/27/2012  PCP: Andrea Antis, MD  Admit date: 09/27/2012 Discharge date: 09/27/2012  Recommendations for Outpatient Follow-up:  1. Followup syncope, see discussion below 2. TSH and hemoglobin A1c pending, followup as an outpatient   Follow-up Information   Follow up with Andrea Antis, MD In 2 weeks.   Contact information:   [address] [place] [address] [phone]      Discharge Diagnoses:  1. Syncope, likely vasovagal  Discharge Condition: Improved Disposition: Home  Diet recommendation: Regular  Filed Weights   09/27/12 0246 09/27/12 0500 09/27/12 0549  Weight: 111.177 kg (245 lb 1.6 oz) 111.177 kg (245 lb 1.6 oz) 112.2 kg (247 lb 5.7 oz)    History of present illness:  57 year old woman who presented to the emergency department with a history of syncope. She was sitting having her hair done when she suddenly became lightheaded and passed out. No seizure activity. No confusion afterwards. Of note she has been taking a diuretic regularly for lower extremity edema.  Hospital Course:  [name] was admitted for observation. She had no recurrent lightheadedness or syncope. Troponins negative, telemetry unremarkable, EKG showed sinus rhythm. Chemistry panel unremarkable. History highly suggestive of vasovagal syncope. She reports she was having her hair done yesterday, got up from one seat and sat in another seat when she became lightheaded and passed out. When she woke up she felt a little weak but otherwise okay. No nausea or vomiting. No focal neurologic deficits. She does report she is taken a diuretic 5 days or at which is not her usual custom. Also her oral intake has been suboptimal. Compression hose have been recommended. If she continues on diuretics for lower extremity edema she was encouraged to consume enough liquids. No further evaluation suggested at this  point.  Consultants: None Procedures: None  Discharge Instructions  Discharge Orders   Future Orders Complete By Expires     Activity as tolerated - No restrictions  As directed     Diet general  As directed     Discharge instructions  As directed     Comments:      Consider compression stockings. If using diuretics be sure to consume enough fluids. Take your time with position changes. Sit or lie down if you feel lightheaded. Call your physician or seek medical attention for recurrent fainting.        Medication List         hydrochlorothiazide 12.5 MG capsule  Commonly known as:  MICROZIDE  Take 1 capsule (12.5 mg total) by mouth daily.       No Known Allergies  The results of significant diagnostics from this hospitalization (including imaging, microbiology, ancillary and laboratory) are listed below for reference.    Significant Diagnostic Studies: Dg Chest 2 View  09/27/2012   *RADIOLOGY REPORT*  Clinical Data: Shortness of breath and weakness; syncope.  CHEST - 2 VIEW  Comparison: Chest radiograph performed 07/20/2010  Findings: The lungs are well-aerated and clear.  There is no evidence of focal opacification, pleural effusion or pneumothorax.  The heart is normal in size; the mediastinal contour is within normal limits.  No acute osseous abnormalities are seen.  IMPRESSION: No acute cardiopulmonary process seen.   Original Report Authenticated By: Tonia Ghent, M.D.   Ct Head Wo Contrast  09/27/2012   *RADIOLOGY REPORT*  Clinical Data: Weakness; shortness of breath and headache. Syncope.  CT HEAD WITHOUT CONTRAST  Technique:  Contiguous axial images were obtained from the base of the skull through the vertex without contrast.  Comparison: None.  Findings: There is no evidence of acute infarction, mass lesion, or intra- or extra-axial hemorrhage on CT.  The posterior fossa, including the cerebellum, brainstem and fourth ventricle, is within normal limits.  The third and  lateral ventricles, and basal ganglia are unremarkable in appearance.  The cerebral hemispheres are symmetric in appearance, with normal gray- white differentiation.  No mass effect or midline shift is seen.  There is no evidence of fracture; visualized osseous structures are unremarkable in appearance.  The visualized portions of the orbits are within normal limits.  The paranasal sinuses and mastoid air cells are well-aerated.  No significant soft tissue abnormalities are seen.  IMPRESSION: Unremarkable noncontrast CT of the head.   Original Report Authenticated By: Tonia Ghent, M.D.    Labs: Basic Metabolic Panel:  Recent Labs Lab 09/27/12 0058 09/27/12 0616  NA 140 141  K 3.6 3.3*  CL 104 104  CO2 26 27  GLUCOSE 93 85  BUN 15 13  CREATININE 0.86 0.76  CALCIUM 9.8 9.7  MG 1.9  --    Liver Function Tests:  Recent Labs Lab 09/27/12 0058  AST 17  ALT 14  ALKPHOS 76  BILITOT 0.9  PROT 8.1  ALBUMIN 3.7   CBC:  Recent Labs Lab 09/27/12 0058 09/27/12 0616  WBC 6.1 6.3  NEUTROABS 3.9  --   HGB 14.2 13.9  HCT 41.5 41.2  MCV 85.7 85.8  PLT 263 246   Cardiac Enzymes:  Recent Labs Lab 09/27/12 0058 09/27/12 0616  TROPONINI <0.30 <0.30    Active Problems:   Morbid obesity   OA (osteoarthritis)   Syncope   Time coordinating discharge: 25 minutes  Signed:  Brendia Sacks, MD [facility] 09/27/2012, 9:03 AM

## 2012-09-27 NOTE — Progress Notes (Signed)
[facility] PROGRESS NOTE  CHERYLANNE ARDELEAN WUJ:[id number] DOB: Feb 13, 1956 DOA: 09/27/2012 PCP: Milinda Antis, MD  Assessment/Plan: 1. Syncope: Likely vasovagal. Recommend compression stockings. If using diuretics be sure to consume enough liquids. Workup including CT of head, telemetry, cardiac enzymes negative. No further evaluation suggested. 2. Lower extremity edema: Treatment as above.   Discharge home  Pending studies:   TSH  Hemoglobin A1c  Code Status: Full code DVT prophylaxis: Lovenox Family Communication: None present Disposition Plan: As above  Brendia Sacks, MD  [facility]  Pager [phone] If 7PM-7AM, please contact night-coverage at [email or web address], password [facility] 09/27/2012, 8:31 AM  LOS: 0 days   Clinical Summary: 57 year old woman who presented to the emergency department with a history of syncope. She was sitting having her hair done when she suddenly became lightheaded and passed out. No seizure activity. No confusion afterwards. Of note she has been taking a diuretic regularly for lower extremity edema.  Consultants:    Procedures:    Antibiotics:    HPI/Subjective: Feels fine this morning. No recurrence of lightheadedness. No recurrent syncope. No complaints. Hopes to go home. She reports she was having her hair done yesterday, got up from one seat and sat in another seat when she became lightheaded and passed out. When she woke up she felt a little weak but otherwise okay. No nausea or vomiting. No focal neurologic deficits. She does report she is taken a diuretic 5 days or at which is not her usual custom. Also her oral intake has been suboptimal.  Objective: Filed Vitals:   09/27/12 0500 09/27/12 0549 09/27/12 0555 09/27/12 0558  BP:  103/69 133/80 106/70  Pulse:  55 63 84  Temp:  97.5 F (36.4 C)    TempSrc:  Oral    Resp:  20    Height:      Weight: 111.177 kg (245 lb 1.6 oz) 112.2 kg (247 lb 5.7 oz)    SpO2:  99%       Intake/Output Summary (Last 24 hours) at 09/27/12 0831 Last data filed at 09/27/12 0500  Gross per 24 hour  Intake    120 ml  Output      0 ml  Net    120 ml     Filed Weights   09/27/12 0246 09/27/12 0500 09/27/12 0549  Weight: 111.177 kg (245 lb 1.6 oz) 111.177 kg (245 lb 1.6 oz) 112.2 kg (247 lb 5.7 oz)    Exam:   Afebrile, vital signs stable.  General: Appears calm and comfortable.  Cardiovascular: Regular rate and rhythm. No murmur, rub, gallop. Trace bilateral lower extremity edema right greater than left.  Telemetry: Sinus rhythm. No arrhythmias.  Respiratory: Clear to auscultation bilaterally. No wheezes, rales, rhonchi. Normal respiratory effort.  Psychiatric: Grossly normal mood and affect. Speech fluent and appropriate.  Data Reviewed:  Potassium 3.3.  Troponin negative.  CBC unremarkable.  CT of head negative.  Chest x-ray unremarkable.  EKG: Normal sinus rhythm. No acute changes.   Scheduled Meds: . aspirin EC  81 mg Oral Daily  . enoxaparin (LOVENOX) injection  55 mg Subcutaneous Q24H  . sodium chloride  3 mL Intravenous Q12H   Continuous Infusions:   Active Problems:   Morbid obesity   OA (osteoarthritis)   Syncope

## 2012-09-27 NOTE — ED Notes (Signed)
Patient states she doesn't feel like herself. Family insisted on her being seen and that she had passed out per ems

## 2012-10-14 ENCOUNTER — Ambulatory Visit (INDEPENDENT_AMBULATORY_CARE_PROVIDER_SITE_OTHER): Payer: BC Managed Care – PPO | Admitting: Family Medicine

## 2012-10-14 VITALS — BP 112/70 | HR 76 | Temp 97.9°F | Resp 20 | Wt 245.0 lb

## 2012-10-14 DIAGNOSIS — J3489 Other specified disorders of nose and nasal sinuses: Secondary | ICD-10-CM

## 2012-10-14 DIAGNOSIS — R0981 Nasal congestion: Secondary | ICD-10-CM

## 2012-10-14 DIAGNOSIS — G5690 Unspecified mononeuropathy of unspecified upper limb: Secondary | ICD-10-CM

## 2012-10-14 DIAGNOSIS — R232 Flushing: Secondary | ICD-10-CM

## 2012-10-14 DIAGNOSIS — G5691 Unspecified mononeuropathy of right upper limb: Secondary | ICD-10-CM

## 2012-10-14 DIAGNOSIS — E876 Hypokalemia: Secondary | ICD-10-CM

## 2012-10-14 DIAGNOSIS — N951 Menopausal and female climacteric states: Secondary | ICD-10-CM

## 2012-10-14 DIAGNOSIS — R55 Syncope and collapse: Secondary | ICD-10-CM

## 2012-10-14 LAB — BASIC METABOLIC PANEL
CO2: 23 mEq/L (ref 19–32)
Chloride: 108 mEq/L (ref 96–112)
Sodium: 142 mEq/L (ref 135–145)

## 2012-10-14 NOTE — Patient Instructions (Addendum)
You can try Estroven for the hot flashes We will call with lab results Call if not improved in 2 weeks  Change F/U to October

## 2012-10-15 ENCOUNTER — Encounter: Payer: Self-pay | Admitting: Family Medicine

## 2012-10-15 DIAGNOSIS — R0981 Nasal congestion: Secondary | ICD-10-CM | POA: Insufficient documentation

## 2012-10-15 DIAGNOSIS — R232 Flushing: Secondary | ICD-10-CM | POA: Insufficient documentation

## 2012-10-15 DIAGNOSIS — E876 Hypokalemia: Secondary | ICD-10-CM | POA: Insufficient documentation

## 2012-10-15 DIAGNOSIS — G5690 Unspecified mononeuropathy of unspecified upper limb: Secondary | ICD-10-CM | POA: Insufficient documentation

## 2012-10-15 NOTE — Assessment & Plan Note (Signed)
Check K level, replaced in hospital

## 2012-10-15 NOTE — Assessment & Plan Note (Signed)
Her symptoms are improving, occurred after blood draw, will give another 2 weeks, if not at baseline send for nerve conduction studies

## 2012-10-15 NOTE — Assessment & Plan Note (Signed)
Post menopausal symptoms Discussed options, she can try Estroven OTC vitamin has some black co hosh

## 2012-10-15 NOTE — Assessment & Plan Note (Signed)
Unchanged, little exercise, also discussed diet

## 2012-10-15 NOTE — Progress Notes (Signed)
  Subjective:    Patient ID: Andrea Eaton, female    DOB: Jul 12, 1955, 57 y.o.   MRN: [id number]  HPI  Pt here to f/u hospital admission for syncope. Discharge note reviewed. Was sitting getting hair done and had syncopal event witnessed by daughter. Had taken HCTZ that week, otherwise no other medications. Cardiac work-up, CT normal, labs showed hypokalemia that was replaced during admission. She did stop HCTZ past week, has not had any other symptoms.  Tingling numbness in right hand and forearm since she had a blood draw during admission. States phlebotomist hit a nerve and since then she has tingling run down arm and numbness in finger tips. Symptoms are improving since she was discharged.  Nasal congestion- has feeling of congestion, denies any URI , sneezing, runny nose, feels like she gets blocked like she is going to have a nosebleed but nothing comes out. Able to breath as normal.  Has had hot flashes and night sweats for past few months, did not think they would return  Review of Systems - per above  GEN- denies fatigue, fever, weight loss,weakness, recent illness HEENT- denies eye drainage, change in vision, nasal discharge, CVS- denies chest pain, palpitations RESP- denies SOB, cough, wheeze ABD- denies N/V, change in stools, abd pain GU- denies dysuria, hematuria, dribbling, incontinence MSK- denies joint pain, muscle aches, injury Neuro- denies headache, dizziness, syncope, seizure activity, +paresthesia hand      Objective:   Physical Exam GEN- NAD, alert and oriented x3 HEENT- PERRL, EOMI, non injected sclera, pink conjunctiva, MMM, oropharynx clear, nares clear, turbinates normal, mild erythema inside right nares, no bleeding noted, no rhinorrhea, no maxillary sinus tenderness, TM clear bilat Neck- Supple, no bruit CVS- RRR, no murmur RESP-CTAB EXT- trace pedal edema Pulses- Radial, DP- 2+ Neuro- CNII-XII in tact, RUE decreased monofilament on tips of all 5 fingers,  gross sensation in tact, + phalen's, +neg tinels,  MSK- FROM UE bilat, normal ROM wrist, elbow right side Skin - in tact, no rash        Assessment & Plan:

## 2012-10-15 NOTE — Assessment & Plan Note (Signed)
Based on evaluation it seems she may have had hypotension with use of her HCTZ Will d/c for now, no further work up needed

## 2012-10-15 NOTE — Assessment & Plan Note (Signed)
Normal exam, no signs of infection

## 2012-12-08 ENCOUNTER — Ambulatory Visit: Payer: BC Managed Care – PPO | Admitting: Family Medicine

## 2013-01-19 ENCOUNTER — Telehealth: Payer: Self-pay | Admitting: Family Medicine

## 2013-01-19 ENCOUNTER — Ambulatory Visit (INDEPENDENT_AMBULATORY_CARE_PROVIDER_SITE_OTHER): Payer: BC Managed Care – PPO | Admitting: Family Medicine

## 2013-01-19 ENCOUNTER — Encounter: Payer: Self-pay | Admitting: Family Medicine

## 2013-01-19 VITALS — BP 170/100 | HR 89 | Temp 97.4°F | Resp 18 | Ht 60.0 in | Wt 249.0 lb

## 2013-01-19 DIAGNOSIS — I10 Essential (primary) hypertension: Secondary | ICD-10-CM

## 2013-01-19 DIAGNOSIS — Z23 Encounter for immunization: Secondary | ICD-10-CM

## 2013-01-19 LAB — CBC
HCT: 39.1 % (ref 36.0–46.0)
Hemoglobin: 13.4 g/dL (ref 12.0–15.0)
RDW: 14.9 % (ref 11.5–15.5)
WBC: 5.3 10*3/uL (ref 4.0–10.5)

## 2013-01-19 MED ORDER — LISINOPRIL-HYDROCHLOROTHIAZIDE 10-12.5 MG PO TABS: 1.0000 | ORAL_TABLET | Freq: Every day | ORAL | Status: DC

## 2013-01-19 NOTE — Telephone Encounter (Signed)
Have her come in at 4:30 pm

## 2013-01-19 NOTE — Telephone Encounter (Signed)
BP are running 184/124  194/134.  Wants to see you today.  No appt available.

## 2013-01-19 NOTE — Progress Notes (Signed)
   Subjective:    Patient ID: Andrea Eaton, female    DOB: 05-21-1955, 57 y.o.   MRN: [id number]  HPI  Patient here secondary to elevated blood pressure. She has a history of hypertension was taken off her medication at the she had a syncopal event back in August. At that time she was not taking the HCTZ 25 mg on a daily basis. She's been feeling a little dizzy headed and has had some leg swelling and fatigue over the past week. She also felt like she was going to have a nosebleed but never had one. She checked her blood pressure at church today and her blood pressure was elevated to 194/134. She denies any chest pain or shortness of breath  Review of Systems   GEN- denies fatigue, fever, weight loss,weakness, recent illness HEENT- denies eye drainage, change in vision, nasal discharge, CVS- denies chest pain, palpitations RESP- denies SOB, cough, wheeze Neuro- +headache, dizziness, syncope, seizure activity      Objective:   Physical Exam GEN- NAD, alert and oriented x3 HEENT- PERRL, EOMI, non injected sclera, pink conjunctiva, MMM, oropharynx clear Neck- Supple,  CVS- RRR, no murmur RESP-CTAB EXT- 1+ edema to pre-tibial region Pulses- Radial, DP- 2+ NEURO-CNII-XII in tact       Assessment & Plan:

## 2013-01-19 NOTE — Patient Instructions (Signed)
Start blood pressure medication- take 1 tonight and 1 in the morning Labs to be done today F/U 4: 15pm tomorrow

## 2013-01-19 NOTE — Assessment & Plan Note (Signed)
Blood pressure is extremely elevated today. I will start her on lisinopril HCTZ to take one tablet tonight and one the morning and that she will followup tomorrow afternoon for blood pressure recheck. We'll also check her metabolic panel and CBC. She's advised to wear her TED hose to help with her swelling as well. Also decrease her salt intake. Unfortunately taking medications on a regular basis has been difficult

## 2013-01-19 NOTE — Telephone Encounter (Signed)
Pt called and told to come in this afternoon per Dr Jeanice Lim

## 2013-01-20 ENCOUNTER — Ambulatory Visit (INDEPENDENT_AMBULATORY_CARE_PROVIDER_SITE_OTHER): Payer: BC Managed Care – PPO | Admitting: Family Medicine

## 2013-01-20 ENCOUNTER — Ambulatory Visit: Payer: BC Managed Care – PPO | Admitting: Family Medicine

## 2013-01-20 VITALS — BP 140/90 | HR 78 | Temp 97.5°F | Resp 18 | Ht 60.0 in | Wt 250.0 lb

## 2013-01-20 DIAGNOSIS — I10 Essential (primary) hypertension: Secondary | ICD-10-CM

## 2013-01-20 LAB — COMPREHENSIVE METABOLIC PANEL
ALT: 11 U/L (ref 0–35)
BUN: 12 mg/dL (ref 6–23)
CO2: 26 mEq/L (ref 19–32)
Calcium: 9 mg/dL (ref 8.4–10.5)
Chloride: 105 mEq/L (ref 96–112)
Creat: 0.73 mg/dL (ref 0.50–1.10)

## 2013-01-20 NOTE — Patient Instructions (Signed)
Continue the lisinopril HCTZ Check Blood pressure at home daily and write down F/U By phone in 1 week  F/U  January as scheduled

## 2013-01-21 ENCOUNTER — Encounter: Payer: Self-pay | Admitting: Family Medicine

## 2013-01-21 NOTE — Assessment & Plan Note (Signed)
Blood pressure is much improved today. I will continue her current medications. Her metabolic panel was within normal limits. Will followup in one week by phone to see how her blood pressures are running.

## 2013-01-21 NOTE — Progress Notes (Signed)
   Subjective:    Patient ID: Andrea Eaton, female    DOB: 1955/04/16, 57 y.o.   MRN: [id number]  HPI  Patient here to followup hypertension. She was seen yesterday secondary to severely elevated blood pressure. She was started on lisinopril HCTZ. She states that she had one bout of diarrhea last night but otherwise is not having difficulty with the medication. Some of her leg swelling is also went down. She denies any headache or dizziness.  Review of Systems - per above  GEN- denies fatigue, fever, weight loss,weakness, recent illness HEENT- denies eye drainage, change in vision, nasal discharge, CVS- denies chest pain, palpitations RESP- denies SOB, cough, wheeze Neuro- denies headache, dizziness, syncope, seizure activity      Objective:   Physical Exam  GEN- NAD, alert and oriented x3 CVS- RRR, no murmur RESP-CTAB EXT- pedal edema       Assessment & Plan:

## 2013-01-22 ENCOUNTER — Telehealth: Payer: Self-pay | Admitting: Family Medicine

## 2013-01-22 NOTE — Telephone Encounter (Signed)
LMTRC

## 2013-01-22 NOTE — Telephone Encounter (Signed)
Pt is calling today because last night her Bp medication made her feel like she was going to pass out  Call back [phone]

## 2013-01-26 NOTE — Telephone Encounter (Signed)
Pt states she is feelign better now

## 2013-03-19 ENCOUNTER — Ambulatory Visit (INDEPENDENT_AMBULATORY_CARE_PROVIDER_SITE_OTHER): Payer: BC Managed Care – PPO | Admitting: Family Medicine

## 2013-03-19 ENCOUNTER — Encounter: Payer: BC Managed Care – PPO | Admitting: Family Medicine

## 2013-03-19 ENCOUNTER — Encounter: Payer: Self-pay | Admitting: Family Medicine

## 2013-03-19 VITALS — BP 130/80 | HR 68 | Temp 97.7°F | Resp 18 | Ht 60.0 in | Wt 245.0 lb

## 2013-03-19 DIAGNOSIS — R202 Paresthesia of skin: Secondary | ICD-10-CM | POA: Insufficient documentation

## 2013-03-19 DIAGNOSIS — R600 Localized edema: Secondary | ICD-10-CM

## 2013-03-19 DIAGNOSIS — I10 Essential (primary) hypertension: Secondary | ICD-10-CM

## 2013-03-19 DIAGNOSIS — Z0000 Encounter for general adult medical examination without abnormal findings: Secondary | ICD-10-CM | POA: Insufficient documentation

## 2013-03-19 DIAGNOSIS — R209 Unspecified disturbances of skin sensation: Secondary | ICD-10-CM

## 2013-03-19 DIAGNOSIS — R609 Edema, unspecified: Secondary | ICD-10-CM

## 2013-03-19 DIAGNOSIS — R55 Syncope and collapse: Secondary | ICD-10-CM | POA: Insufficient documentation

## 2013-03-19 MED ORDER — LISINOPRIL 10 MG PO TABS: 10.0000 mg | ORAL_TABLET | Freq: Every day | ORAL | Status: DC

## 2013-03-19 NOTE — Assessment & Plan Note (Signed)
Change to lisinopril one tablet daily with importance of weight loss as well as diet control

## 2013-03-19 NOTE — Patient Instructions (Signed)
Blood pressure medication has been changed to lisinopril only- take every day Use compression hose  F/U 6 Weeks for blood pressure

## 2013-03-19 NOTE — Assessment & Plan Note (Signed)
Physical done. She will set up her mammogram. Fasting labs done.

## 2013-03-19 NOTE — Assessment & Plan Note (Signed)
For now I will have her use compression toes she was also given a new prescription as well as elevation of the feet appear

## 2013-03-19 NOTE — Assessment & Plan Note (Signed)
Very minimal symptoms I wonder if this is due to some the swelling that she has in her feet she is not diabetic shoe normal A1c back in August I will recheck her blood glucose. At this time we will monitor his symptoms I do not think is severe enough to warrant any medication or nerve conduction study at this time

## 2013-03-19 NOTE — Progress Notes (Signed)
   Subjective:    Patient ID: Andrea Eaton, female    DOB: [birth year], 58 y.o.   MRN: [id number]  HPI  Patient here for complete physical exam. Her Pap smear was normal in January 2014 she's never had any abnormal Pap smears therefore is on 3 years cycle. Her colonoscopy is up to date her immunizations are up-to-date. She is history of hypertension at her last visit she was on lisinopril HCTZ she states since then she had another episode similar to about 6 months ago when she had a syncopal event. She was concern because her prescription said that she should not be under artificial light or sunlight with her blood pressure medication she was sitting in front of a heater and felt faint began in sick similar to how she did 6 months ago. At that time she was on hydrochlorothiazide 25 mg which was discontinued and was thought that she was also not drinking enough. She's not been taking her blood pressure medication for the past week or so since the event. If not had any chest drainage shortness of breath no headaches or dizziness she does get swelling in her ankles and has noted some tingling on her right great toe only for 1 month.   Review of Systems - per above   GEN- denies fatigue, fever, weight loss,weakness, recent illness HEENT- denies eye drainage, change in vision, nasal discharge, CVS- denies chest pain, palpitations RESP- denies SOB, cough, wheeze ABD- denies N/V, change in stools, abd pain GU- denies dysuria, hematuria, dribbling, incontinence MSK- denies joint pain, muscle aches, injury Neuro- denies headache, dizziness, syncope, seizure activity        Objective:   Physical Exam  GEN- NAD, alert and oriented x3 HEENT- PERRL, EOMI, non injected sclera, pink conjunctiva, MMM, oropharynx clear Neck- supple, no thyromegaly, no bruits Breast- normal symmetry, no nipple inversion,no nipple drainage, no nodules or lumps felt Nodes- no axillary nodes CVS- RRR, no  murmur RESP-CTAB ABD-NABS,soft,NT,ND GU-Deferred EXT- pedal edema Pulses- Radial, DP- 2+ Neuro- CNII-XII in tact, normal tone, gross sensation LE in tact, motor equal bilat, Right great toe decreased monofilament Skin- dry skin, callus bilat  EKG-NSR, no ST changes     Assessment & Plan:

## 2013-03-19 NOTE — Assessment & Plan Note (Signed)
She has significant workup while in the hospital before the previous syncopal event the only culprit and dehydration with the hydrochlorothiazide. To have her back on low-dose diuretic I will completely take this out of the picture put her on lisinopril 10 mg. Her repeat EKG was normal her exam is fairly benign there is no neurological deficits she did not actually have a syncopal event just felt faint. Also have her increase her fluid intake

## 2013-03-29 LAB — CBC WITH DIFFERENTIAL/PLATELET
Basophils Absolute: 0 10*3/uL (ref 0.0–0.1)
Basophils Relative: 0 % (ref 0–1)
EOS PCT: 2 % (ref 0–5)
Eosinophils Absolute: 0.1 10*3/uL (ref 0.0–0.7)
HEMATOCRIT: 37.5 % (ref 36.0–46.0)
Hemoglobin: 12.8 g/dL (ref 12.0–15.0)
LYMPHS ABS: 1.7 10*3/uL (ref 0.7–4.0)
LYMPHS PCT: 43 % (ref 12–46)
MCH: 28.4 pg (ref 26.0–34.0)
MCHC: 34.1 g/dL (ref 30.0–36.0)
MCV: 83.1 fL (ref 78.0–100.0)
MONO ABS: 0.3 10*3/uL (ref 0.1–1.0)
Monocytes Relative: 7 % (ref 3–12)
Neutro Abs: 1.9 10*3/uL (ref 1.7–7.7)
Neutrophils Relative %: 48 % (ref 43–77)
Platelets: 241 10*3/uL (ref 150–400)
RBC: 4.51 MIL/uL (ref 3.87–5.11)
RDW: 15.3 % (ref 11.5–15.5)
WBC: 4 10*3/uL (ref 4.0–10.5)

## 2013-03-29 LAB — BASIC METABOLIC PANEL
BUN: 9 mg/dL (ref 6–23)
CALCIUM: 8.7 mg/dL (ref 8.4–10.5)
CO2: 27 meq/L (ref 19–32)
CREATININE: 0.73 mg/dL (ref 0.50–1.10)
Chloride: 106 mEq/L (ref 96–112)
GLUCOSE: 72 mg/dL (ref 70–99)
Potassium: 4.1 mEq/L (ref 3.5–5.3)
Sodium: 141 mEq/L (ref 135–145)

## 2013-03-29 LAB — LIPID PANEL
CHOLESTEROL: 143 mg/dL (ref 0–200)
HDL: 59 mg/dL (ref >39)
LDL Cholesterol: 73 mg/dL (ref 0–99)
Total CHOL/HDL Ratio: 2.4 Ratio
Triglycerides: 53 mg/dL (ref <150)
VLDL: 11 mg/dL (ref 0–40)

## 2013-03-30 ENCOUNTER — Encounter: Payer: Self-pay | Admitting: *Deleted

## 2013-04-08 ENCOUNTER — Encounter: Payer: Self-pay | Admitting: Family Medicine

## 2013-12-17 ENCOUNTER — Other Ambulatory Visit: Payer: Self-pay | Admitting: Family Medicine

## 2013-12-17 DIAGNOSIS — Z1231 Encounter for screening mammogram for malignant neoplasm of breast: Secondary | ICD-10-CM

## 2013-12-20 ENCOUNTER — Ambulatory Visit (HOSPITAL_COMMUNITY)
Admission: RE | Admit: 2013-12-20 | Discharge: 2013-12-20 | Disposition: A | Discharge status: Home / Self Care | Payer: BC Managed Care – PPO | Source: Ambulatory Visit | Attending: Family Medicine | Admitting: Family Medicine

## 2013-12-20 DIAGNOSIS — Z1231 Encounter for screening mammogram for malignant neoplasm of breast: Secondary | ICD-10-CM | POA: Diagnosis present

## 2014-10-25 ENCOUNTER — Other Ambulatory Visit: Payer: Self-pay | Admitting: Family Medicine

## 2014-10-25 DIAGNOSIS — I10 Essential (primary) hypertension: Secondary | ICD-10-CM

## 2014-10-25 DIAGNOSIS — Z79899 Other long term (current) drug therapy: Secondary | ICD-10-CM

## 2014-10-26 LAB — COMPREHENSIVE METABOLIC PANEL
ALT: 11 U/L (ref 6–29)
AST: 16 U/L (ref 10–35)
Albumin: 3.5 g/dL — ABNORMAL LOW (ref 3.6–5.1)
Alkaline Phosphatase: 64 U/L (ref 33–130)
BILIRUBIN TOTAL: 0.8 mg/dL (ref 0.2–1.2)
BUN: 15 mg/dL (ref 7–25)
CHLORIDE: 106 mmol/L (ref 98–110)
CO2: 25 mmol/L (ref 20–31)
CREATININE: 0.76 mg/dL (ref 0.50–1.05)
Calcium: 8.8 mg/dL (ref 8.6–10.4)
Glucose, Bld: 84 mg/dL (ref 70–99)
Potassium: 3.9 mmol/L (ref 3.5–5.3)
SODIUM: 138 mmol/L (ref 135–146)
TOTAL PROTEIN: 6.6 g/dL (ref 6.1–8.1)

## 2014-10-26 LAB — CBC WITH DIFFERENTIAL/PLATELET
BASOS ABS: 0 10*3/uL (ref 0.0–0.1)
BASOS PCT: 0 % (ref 0–1)
EOS ABS: 0.1 10*3/uL (ref 0.0–0.7)
Eosinophils Relative: 2 % (ref 0–5)
HCT: 36.3 % (ref 36.0–46.0)
HEMOGLOBIN: 12.8 g/dL (ref 12.0–15.0)
Lymphocytes Relative: 46 % (ref 12–46)
Lymphs Abs: 2.2 10*3/uL (ref 0.7–4.0)
MCH: 29.2 pg (ref 26.0–34.0)
MCHC: 35.3 g/dL (ref 30.0–36.0)
MCV: 82.7 fL (ref 78.0–100.0)
MONOS PCT: 8 % (ref 3–12)
MPV: 9.4 fL (ref 8.6–12.4)
Monocytes Absolute: 0.4 10*3/uL (ref 0.1–1.0)
NEUTROS ABS: 2.1 10*3/uL (ref 1.7–7.7)
NEUTROS PCT: 44 % (ref 43–77)
PLATELETS: 251 10*3/uL (ref 150–400)
RBC: 4.39 MIL/uL (ref 3.87–5.11)
RDW: 14.7 % (ref 11.5–15.5)
WBC: 4.7 10*3/uL (ref 4.0–10.5)

## 2014-10-26 LAB — TSH: TSH: 2.833 u[IU]/mL (ref 0.350–4.500)

## 2014-10-28 ENCOUNTER — Ambulatory Visit (INDEPENDENT_AMBULATORY_CARE_PROVIDER_SITE_OTHER): Payer: BC Managed Care – PPO | Admitting: Family Medicine

## 2014-10-28 ENCOUNTER — Encounter: Payer: Self-pay | Admitting: Family Medicine

## 2014-10-28 VITALS — BP 132/78 | HR 72 | Temp 98.0°F | Resp 18 | Ht 60.0 in | Wt 261.0 lb

## 2014-10-28 DIAGNOSIS — I10 Essential (primary) hypertension: Secondary | ICD-10-CM

## 2014-10-28 DIAGNOSIS — Z23 Encounter for immunization: Secondary | ICD-10-CM

## 2014-10-28 DIAGNOSIS — Z0000 Encounter for general adult medical examination without abnormal findings: Secondary | ICD-10-CM | POA: Diagnosis not present

## 2014-10-28 DIAGNOSIS — K649 Unspecified hemorrhoids: Secondary | ICD-10-CM | POA: Insufficient documentation

## 2014-10-28 DIAGNOSIS — M159 Polyosteoarthritis, unspecified: Secondary | ICD-10-CM

## 2014-10-28 DIAGNOSIS — Z13220 Encounter for screening for lipoid disorders: Secondary | ICD-10-CM

## 2014-10-28 DIAGNOSIS — M25562 Pain in left knee: Secondary | ICD-10-CM | POA: Diagnosis not present

## 2014-10-28 DIAGNOSIS — M25561 Pain in right knee: Secondary | ICD-10-CM | POA: Diagnosis not present

## 2014-10-28 DIAGNOSIS — R600 Localized edema: Secondary | ICD-10-CM | POA: Diagnosis not present

## 2014-10-28 DIAGNOSIS — M150 Primary generalized (osteo)arthritis: Secondary | ICD-10-CM | POA: Diagnosis not present

## 2014-10-28 DIAGNOSIS — M8949 Other hypertrophic osteoarthropathy, multiple sites: Secondary | ICD-10-CM

## 2014-10-28 DIAGNOSIS — M25512 Pain in left shoulder: Secondary | ICD-10-CM

## 2014-10-28 MED ORDER — HYDROCORTISONE ACETATE 25 MG RE SUPP: 25.0000 mg | Freq: Two times a day (BID) | RECTAL | Status: DC

## 2014-10-28 MED ORDER — LISINOPRIL 10 MG PO TABS: 10.0000 mg | ORAL_TABLET | Freq: Every day | ORAL | Status: DC

## 2014-10-28 MED ORDER — FUROSEMIDE 20 MG PO TABS: 20.0000 mg | ORAL_TABLET | Freq: Every day | ORAL | Status: DC

## 2014-10-28 NOTE — Progress Notes (Signed)
Patient ID: Andrea Eaton, female   DOB: July 22, 1955, 59 y.o.   MRN: [id number]   Subjective:    Patient ID: Andrea Eaton, female    DOB: 1955/11/06, 59 y.o.   MRN: [id number]  Patient presents for CPE~ no PAP  patient here for complete physical exam. She's not been seen since January 2015. She is history of hypertension she has not been taking her medications on a regular basis but started them back a few weeks ago when she does her blood pressure was high. She has had chronic lower extremity edema which is unchanged. She continues to have bilateral knee pain her weight is actually up 15 pounds since I last saw her she also has chronic left shoulder pain she has not had imaging of either but has known arthritis. She has not been taking anything over-the-counter. She does know that her knee pain is worse since she has gained weight. Her Pap smears up-to-date. Sure colonoscopy is up-to-date however she has had intermittent episodes of bright red blood per rectum which is painless after more looser bowel movements this is been on and off for the past 6-8 months.    Review Of Systems: per above   GEN- denies fatigue, fever, weight loss,weakness, recent illness HEENT- denies eye drainage, change in vision, nasal discharge, CVS- denies chest pain, palpitations RESP- denies SOB, cough, wheeze ABD- denies N/V, change in stools, abd pain GU- denies dysuria, hematuria, dribbling, incontinence MSK- +joint pain, muscle aches, injury Neuro- denies headache, dizziness, syncope, seizure activity       Objective:    BP 132/78 mmHg  Pulse 72  Temp(Src) 98 F (36.7 C) (Oral)  Resp 18  Ht 5' (1.524 m)  Wt 261 lb (118.389 kg)  BMI 50.97 kg/m2 GEN- NAD, alert and oriented x3,obese  HEENT- PERRL, EOMI, non injected sclera, pink conjunctiva, MMM, oropharynx clear Neck- Supple, no thyromegaly, no JVD CVS- RRR, no murmur RESP-CTAB ABD-NABS,soft,NT,ND EXT- 1+ edema bilat R >L MSK - bilat inspection no  apparent effusion, large knees, +crepitus, fair ROM, left shoulder- rotator cuff intact, fair ROM, no swelling Pulses- Radial, DP- 2+        Assessment & Plan:      Problem List Items Addressed This Visit    Routine general medical examination at a health care facility     Complete physical done. Colonoscopy is up-to-date I think that the bleeding is coming from hemorrhoids. Fecal occult negative today on exam. If the bleeding does not improve with the use of the steroid-induced suppositories and I will get her set up with her gastroenterologist. Pap smear up-to-date. Mammogram will be done this winter. Cholesterol will be checked today.  flu shot given      OA (osteoarthritis)     She has no multiple joint arthritis. On Medicaid images of her knees in his shoulder. We'll use topical creams right now as we addressed the swelling in her legs.      Relevant Orders   DG Knee Complete 4 Views Left   DG Knee Complete 4 Views Right   Morbid obesity   Leg edema     Start Lasix 20 mg once a day recheck in one week      Hemorrhoids   Relevant Medications   furosemide (LASIX) 20 MG tablet   lisinopril (PRINIVIL,ZESTRIL) 10 MG tablet   Essential hypertension     Blood pressure looks good today no change to lisinopril but was important to take it on a  regular basis      Relevant Medications   furosemide (LASIX) 20 MG tablet   lisinopril (PRINIVIL,ZESTRIL) 10 MG tablet    Other Visit Diagnoses    Screening cholesterol level    -  Primary    Relevant Orders    Lipid panel    Pain in joint, shoulder region, left        Relevant Orders    DG Shoulder Left    Bilateral knee pain        Relevant Orders    DG Knee Complete 4 Views Left    DG Knee Complete 4 Views Right       Note: This dictation was prepared with Dragon dictation along with smaller phrase technology. Any transcriptional errors that result from this process are unintentional.

## 2014-10-28 NOTE — Assessment & Plan Note (Signed)
Blood pressure looks good today no change to lisinopril but was important to take it on a regular basis

## 2014-10-28 NOTE — Patient Instructions (Signed)
Mammogram due in November Flu shot given Take lasix 20mg  once a day for next week Continue lisinopril Low salt diet, low carb diet Get the xray of your knee and shoulder done Use topical Aspercreme or biofreeze F/U 1 week for recheck on swelling

## 2014-10-28 NOTE — Assessment & Plan Note (Signed)
She has no multiple joint arthritis. On Medicaid images of her knees in his shoulder. We'll use topical creams right now as we addressed the swelling in her legs.

## 2014-10-28 NOTE — Addendum Note (Signed)
Addended by: Sheral Flow on: 10/28/2014 05:19 PM   Modules accepted: Orders

## 2014-10-28 NOTE — Assessment & Plan Note (Signed)
Start Lasix 20 mg once a day recheck in one week

## 2014-10-28 NOTE — Assessment & Plan Note (Signed)
Complete physical done. Colonoscopy is up-to-date I think that the bleeding is coming from hemorrhoids. Fecal occult negative today on exam. If the bleeding does not improve with the use of the steroid-induced suppositories and I will get her set up with her gastroenterologist. Pap smear up-to-date. Mammogram will be done this winter. Cholesterol will be checked today.  flu shot given

## 2014-10-29 LAB — LIPID PANEL
CHOLESTEROL: 147 mg/dL (ref 125–200)
HDL: 60 mg/dL (ref >=46)
LDL CALC: 76 mg/dL (ref <130)
TRIGLYCERIDES: 55 mg/dL (ref <150)
Total CHOL/HDL Ratio: 2.5 Ratio (ref <=5.0)
VLDL: 11 mg/dL (ref <30)

## 2014-11-03 ENCOUNTER — Ambulatory Visit (HOSPITAL_COMMUNITY)
Admission: RE | Admit: 2014-11-03 | Discharge: 2014-11-03 | Disposition: A | Discharge status: Home / Self Care | Payer: BC Managed Care – PPO | Source: Ambulatory Visit | Attending: Family Medicine | Admitting: Family Medicine

## 2014-11-03 DIAGNOSIS — M25512 Pain in left shoulder: Secondary | ICD-10-CM

## 2014-11-03 DIAGNOSIS — M25561 Pain in right knee: Secondary | ICD-10-CM

## 2014-11-03 DIAGNOSIS — M159 Polyosteoarthritis, unspecified: Secondary | ICD-10-CM

## 2014-11-03 DIAGNOSIS — M150 Primary generalized (osteo)arthritis: Secondary | ICD-10-CM | POA: Insufficient documentation

## 2014-11-03 DIAGNOSIS — M25562 Pain in left knee: Secondary | ICD-10-CM

## 2014-11-04 ENCOUNTER — Ambulatory Visit (INDEPENDENT_AMBULATORY_CARE_PROVIDER_SITE_OTHER): Payer: BC Managed Care – PPO | Admitting: Family Medicine

## 2014-11-04 ENCOUNTER — Encounter: Payer: Self-pay | Admitting: Family Medicine

## 2014-11-04 VITALS — BP 132/76 | HR 78 | Temp 97.9°F | Resp 14 | Ht 60.0 in | Wt 257.0 lb

## 2014-11-04 DIAGNOSIS — M159 Polyosteoarthritis, unspecified: Secondary | ICD-10-CM

## 2014-11-04 DIAGNOSIS — R600 Localized edema: Secondary | ICD-10-CM

## 2014-11-04 DIAGNOSIS — M8949 Other hypertrophic osteoarthropathy, multiple sites: Secondary | ICD-10-CM

## 2014-11-04 DIAGNOSIS — M150 Primary generalized (osteo)arthritis: Secondary | ICD-10-CM

## 2014-11-04 NOTE — Progress Notes (Signed)
Patient ID: Andrea Eaton, female   DOB: [birth year], 59 y.o.   MRN: [id number]   Subjective:    Patient ID: Andrea Eaton, female    DOB: 04/29/1955, 59 y.o.   MRN: [id number]  Patient presents for F/U Edema  patient here for interim follow-up. Her weight is down 4 pounds. She has noticed some swelling go down. We also review her x-rays bilateral knees which showed severe tricompartmental osteoarthritis as well as some loose bodies in the joint also has arthritis of the before meals joint in her shoulder. She wants to hold off on orthopedics right now she is helping to care for her husband and use topicals. Lipid panel normal    Review Of Systems:  GEN- denies fatigue, fever, weight loss,weakness, recent illness HEENT- denies eye drainage, change in vision, nasal discharge, CVS- denies chest pain, palpitations RESP- denies SOB, cough, wheeze ABD- denies N/V, change in stools, abd pain GU- denies dysuria, hematuria, dribbling, incontinence MSK-+joint pain, muscle aches, injury Neuro- denies headache, dizziness, syncope, seizure activity       Objective:    BP 132/76 mmHg  Pulse 78  Temp(Src) 97.9 F (36.6 C) (Oral)  Resp 14  Ht 5' (1.524 m)  Wt 257 lb (116.574 kg)  BMI 50.19 kg/m2 GEN- NAD, alert and oriented x3,obese ,weight down 4lbs  CVS- RRR, no murmur RESP-CTAB ABD-NABS,soft,NT,ND EXT- 1+ edema bilat R >L (improved- mostly pedal) Pulses- Radial 2+, DP- 1+      Assessment & Plan:      Problem List Items Addressed This Visit    None      Note: This dictation was prepared with Dragon dictation along with smaller phrase technology. Any transcriptional errors that result from this process are unintentional.

## 2014-11-04 NOTE — Assessment & Plan Note (Signed)
She has multiple joint osteoarthritis especially of the knees which are quite severe and her weight is making this more difficult. She does not want to have orthopedics intervene at this time. I think that she is going to need knee replacement based on the severity of arthritis. She is going to use topical anti-inflammatory

## 2014-11-04 NOTE — Patient Instructions (Addendum)
Take 40mg  of lasix for 3 days, take with over counter potassium (99mg ) once a day  Use Aspercreme for your knees TED hose  We will call with results  F/U 3 months

## 2014-11-04 NOTE — Assessment & Plan Note (Signed)
I think her chronic edema due to her significant weight no sign of heart failure or liver failure. Then increase her Lasix for the next 3 days to 40 mg she is also to take over-the-counter potassium with this. We will follow-up by phone in a couple weeks to see how her swelling is doing. She is also going to wear compression hose which she has at home her blood pressure looks okay. We discussed the signs of hypotension

## 2014-11-18 ENCOUNTER — Telehealth: Payer: Self-pay | Admitting: Family Medicine

## 2014-11-18 NOTE — Telephone Encounter (Signed)
-----   Message from [place], LPN sent at 5/46/5035  1:40 PM EDT ----- Regarding: RE: F/U leg swelling Call placed to patient.   Patient reports that weight has fluctuated, but no more than 1-2 lbs in either gain or loss.   Patient also reports that edema to R leg continues to go up and down, but it is much improved.   ----- Message -----    From: Alycia Rossetti, MD    Sent: 11/18/2014  10:43 AM      To: Eden Lathe Six, LPN Subject: FW: F/U leg swelling                           Call pt and see how leg swelling did with increased dose of lasix  ----- Message -----    From: Alycia Rossetti, MD    Sent: 11/16/2014      To: Alycia Rossetti, MD Subject: F/U leg swelling

## 2015-01-17 ENCOUNTER — Other Ambulatory Visit: Payer: Self-pay | Admitting: Family Medicine

## 2015-01-17 DIAGNOSIS — Z1231 Encounter for screening mammogram for malignant neoplasm of breast: Secondary | ICD-10-CM

## 2015-01-20 ENCOUNTER — Ambulatory Visit (HOSPITAL_COMMUNITY): Payer: BC Managed Care – PPO

## 2015-01-25 ENCOUNTER — Telehealth: Payer: Self-pay | Admitting: Family Medicine

## 2015-01-25 ENCOUNTER — Other Ambulatory Visit: Payer: Self-pay | Admitting: *Deleted

## 2015-01-25 DIAGNOSIS — I10 Essential (primary) hypertension: Secondary | ICD-10-CM

## 2015-01-25 DIAGNOSIS — Z13220 Encounter for screening for lipoid disorders: Secondary | ICD-10-CM

## 2015-01-25 NOTE — Telephone Encounter (Signed)
Pt has scheduled her 3 mo f/u for 02/05/18 w/ Dr. Buelah Manis. She wanted to know if Dr. Buelah Manis could go ahead and put in the orders for what blood work she wants her to have so that she can have it drawn tomorrow. She states that she is taking her husband for his blood work tomorrow across from [employer] and it would be helpful if she could go ahead and have hers drawn too. You can reach her @ [phone]

## 2015-01-25 NOTE — Telephone Encounter (Signed)
Lab orders faxed.

## 2015-01-26 ENCOUNTER — Other Ambulatory Visit: Payer: Self-pay | Admitting: Family Medicine

## 2015-01-26 ENCOUNTER — Ambulatory Visit (HOSPITAL_COMMUNITY)
Admission: RE | Admit: 2015-01-26 | Discharge: 2015-01-26 | Disposition: A | Discharge status: Home / Self Care | Payer: BC Managed Care – PPO | Source: Ambulatory Visit | Attending: Family Medicine | Admitting: Family Medicine

## 2015-01-26 DIAGNOSIS — Z1231 Encounter for screening mammogram for malignant neoplasm of breast: Secondary | ICD-10-CM | POA: Diagnosis not present

## 2015-01-26 LAB — CBC WITH DIFFERENTIAL/PLATELET
BASOS ABS: 0 10*3/uL (ref 0.0–0.1)
Basophils Relative: 0 % (ref 0–1)
EOS ABS: 0.1 10*3/uL (ref 0.0–0.7)
EOS PCT: 2 % (ref 0–5)
HCT: 38.3 % (ref 36.0–46.0)
Hemoglobin: 13.1 g/dL (ref 12.0–15.0)
Lymphocytes Relative: 42 % (ref 12–46)
Lymphs Abs: 2.3 10*3/uL (ref 0.7–4.0)
MCH: 28.9 pg (ref 26.0–34.0)
MCHC: 34.2 g/dL (ref 30.0–36.0)
MCV: 84.5 fL (ref 78.0–100.0)
MPV: 9.7 fL (ref 8.6–12.4)
Monocytes Absolute: 0.4 10*3/uL (ref 0.1–1.0)
Monocytes Relative: 7 % (ref 3–12)
NEUTROS PCT: 49 % (ref 43–77)
Neutro Abs: 2.6 10*3/uL (ref 1.7–7.7)
PLATELETS: 242 10*3/uL (ref 150–400)
RBC: 4.53 MIL/uL (ref 3.87–5.11)
RDW: 15.1 % (ref 11.5–15.5)
WBC: 5.4 10*3/uL (ref 4.0–10.5)

## 2015-01-26 LAB — COMPLETE METABOLIC PANEL WITH GFR
ALT: 15 U/L (ref 6–29)
AST: 19 U/L (ref 10–35)
Albumin: 3.3 g/dL — ABNORMAL LOW (ref 3.6–5.1)
Alkaline Phosphatase: 72 U/L (ref 33–130)
BILIRUBIN TOTAL: 0.7 mg/dL (ref 0.2–1.2)
BUN: 19 mg/dL (ref 7–25)
CALCIUM: 9 mg/dL (ref 8.6–10.4)
CHLORIDE: 111 mmol/L — AB (ref 98–110)
CO2: 25 mmol/L (ref 20–31)
CREATININE: 0.67 mg/dL (ref 0.50–1.05)
Glucose, Bld: 81 mg/dL (ref 70–99)
Potassium: 4.1 mmol/L (ref 3.5–5.3)
Sodium: 143 mmol/L (ref 135–146)
TOTAL PROTEIN: 6.7 g/dL (ref 6.1–8.1)

## 2015-01-26 LAB — LIPID PANEL
CHOLESTEROL: 144 mg/dL (ref 125–200)
HDL: 71 mg/dL (ref >=46)
LDL Cholesterol: 65 mg/dL (ref <130)
TRIGLYCERIDES: 41 mg/dL (ref <150)
Total CHOL/HDL Ratio: 2 Ratio (ref <=5.0)
VLDL: 8 mg/dL (ref <30)

## 2015-02-03 ENCOUNTER — Ambulatory Visit: Payer: BC Managed Care – PPO | Admitting: Family Medicine

## 2015-02-06 ENCOUNTER — Encounter: Payer: Self-pay | Admitting: Family Medicine

## 2015-02-06 ENCOUNTER — Ambulatory Visit (INDEPENDENT_AMBULATORY_CARE_PROVIDER_SITE_OTHER): Payer: BC Managed Care – PPO | Admitting: Family Medicine

## 2015-02-06 VITALS — BP 130/78 | HR 68 | Temp 97.7°F | Resp 18 | Wt 254.0 lb

## 2015-02-06 DIAGNOSIS — M150 Primary generalized (osteo)arthritis: Secondary | ICD-10-CM

## 2015-02-06 DIAGNOSIS — M159 Polyosteoarthritis, unspecified: Secondary | ICD-10-CM

## 2015-02-06 DIAGNOSIS — R600 Localized edema: Secondary | ICD-10-CM

## 2015-02-06 DIAGNOSIS — I10 Essential (primary) hypertension: Secondary | ICD-10-CM | POA: Diagnosis not present

## 2015-02-06 DIAGNOSIS — G4700 Insomnia, unspecified: Secondary | ICD-10-CM | POA: Diagnosis not present

## 2015-02-06 DIAGNOSIS — Z13220 Encounter for screening for lipoid disorders: Secondary | ICD-10-CM

## 2015-02-06 DIAGNOSIS — M8949 Other hypertrophic osteoarthropathy, multiple sites: Secondary | ICD-10-CM

## 2015-02-06 MED ORDER — LISINOPRIL 10 MG PO TABS: 10.0000 mg | ORAL_TABLET | Freq: Every day | ORAL | Status: DC

## 2015-02-06 MED ORDER — FUROSEMIDE 20 MG PO TABS: 40.0000 mg | ORAL_TABLET | Freq: Every day | ORAL | Status: DC

## 2015-02-06 NOTE — Assessment & Plan Note (Signed)
Trial of melatonin if this does not work will try trazodone

## 2015-02-06 NOTE — Assessment & Plan Note (Signed)
Okay to go up to Lasix 40 mg once a day for swelling

## 2015-02-06 NOTE — Progress Notes (Signed)
Patient ID: Andrea Eaton, female   DOB: [birth year], 59 y.o.   MRN: [id number]   Subjective:    Patient ID: Andrea Eaton, female    DOB: 08/06/1955, 59 y.o.   MRN: [id number]  Patient presents for Follow-up  patient here for follow-up. She has been taking her medicines as prescribed. She does continue to have a lot of problems with both of her knees which she has severe arthritis with loose bodies. She is willing to go see orthopedics at this time she only takes Tylenol twice a day. She still has a significant amount of weight which is causing a lot of the pain on her joints. She states that she has not eat very healthy and she is taking care of her husband.    Review Of Systems:  GEN- denies fatigue, fever, weight loss,weakness, recent illness HEENT- denies eye drainage, change in vision, nasal discharge, CVS- denies chest pain, palpitations RESP- denies SOB, cough, wheeze ABD- denies N/V, change in stools, abd pain GU- denies dysuria, hematuria, dribbling, incontinence MSK- denies joint pain, muscle aches, injury Neuro- denies headache, dizziness, syncope, seizure activity       Objective:    BP 130/78 mmHg  Pulse 68  Temp(Src) 97.7 F (36.5 C) (Oral)  Resp 18  Wt 254 lb (115.214 kg) GEN- NAD, alert and oriented x3 HEENT- PERRL, EOMI, non injected sclera, pink conjunctiva, MMM, oropharynx clear CVS- RRR, no murmur RESP-CTAB Bilat knees- no effusion, fair ROM, +crepitus EXT- 1+ pedal edema Pulses- Radial, DP- 2+        Assessment & Plan:      Problem List Items Addressed This Visit    OA (osteoarthritis)     Osteoarthritis of the knees. On the refer her to orthopedics. Continue Tylenol twice a day      Morbid obesity ([name])   Leg edema    Okay to go up to Lasix 40 mg once a day for swelling      Insomnia    Trial of melatonin if this does not work will try trazodone      Essential hypertension - Primary    Blood pressure well controlled Reviewed fasting  labs at bedside       Other Visit Diagnoses    Screening cholesterol level        Essential hypertension, benign           Note: This dictation was prepared with Dragon dictation along with smaller phrase technology. Any transcriptional errors that result from this process are unintentional.

## 2015-02-06 NOTE — Assessment & Plan Note (Signed)
Blood pressure well controlled Reviewed fasting labs at bedside

## 2015-02-06 NOTE — Patient Instructions (Signed)
Melatonin- `1 capsule  Work on diet  Labs look good Referral to orthopedics Tylenol safe to take  F/U 4 month

## 2015-02-06 NOTE — Assessment & Plan Note (Signed)
  Osteoarthritis of the knees. On the refer her to orthopedics. Continue Tylenol twice a day

## 2015-02-21 ENCOUNTER — Encounter: Payer: Self-pay | Admitting: Gastroenterology

## 2015-06-05 ENCOUNTER — Ambulatory Visit (INDEPENDENT_AMBULATORY_CARE_PROVIDER_SITE_OTHER): Payer: BC Managed Care – PPO | Admitting: Family Medicine

## 2015-06-05 ENCOUNTER — Encounter: Payer: Self-pay | Admitting: Family Medicine

## 2015-06-05 VITALS — BP 132/76 | HR 78 | Temp 97.7°F | Resp 14 | Ht 60.0 in | Wt 253.0 lb

## 2015-06-05 DIAGNOSIS — I10 Essential (primary) hypertension: Secondary | ICD-10-CM | POA: Diagnosis not present

## 2015-06-05 DIAGNOSIS — M8949 Other hypertrophic osteoarthropathy, multiple sites: Secondary | ICD-10-CM

## 2015-06-05 DIAGNOSIS — M150 Primary generalized (osteo)arthritis: Secondary | ICD-10-CM

## 2015-06-05 DIAGNOSIS — M545 Low back pain, unspecified: Secondary | ICD-10-CM

## 2015-06-05 DIAGNOSIS — M159 Polyosteoarthritis, unspecified: Secondary | ICD-10-CM

## 2015-06-05 NOTE — Assessment & Plan Note (Signed)
Blood pressures a little elevated today however she has not had her diuretic.

## 2015-06-05 NOTE — Assessment & Plan Note (Signed)
Morbidly obese discussed importance of weight loss and how this affects her back pain and knee pain.

## 2015-06-05 NOTE — Progress Notes (Signed)
Patient ID: Andrea Eaton, female   DOB: 1956-01-25, 60 y.o.   MRN: [id number]   Subjective:    Patient ID: Andrea Eaton, female    DOB: February 19, 1956, 60 y.o.   MRN: [id number]  Patient presents for 4 month F/U  Pt here for follow-up. She is here today with her husband. He continues to have bilateral knee pain she would like to proceed with seeing orthopedics. Today she's also had some low back pain she's had this on and off in the past. She feels a catch in her lower back no radiating symptoms. She does not like to take any medications by mouth. She's been using topical rubs. No change in bowel or bladder.  She is taking her blood pressure medicine on a regular basis but she did not take her diuretic today she will skip the diuretic if out in public. She is also not very diligent with her diet she was skipped meals where she'll just snack all day long and has not lost any weight. She also tends E a lot of salty foods and eat canned vegetables.  Note she cares for husband who is a bilateral  Lower amputee  Review Of Systems:  GEN- denies fatigue, fever, weight loss,weakness, recent illness HEENT- denies eye drainage, change in vision, nasal discharge, CVS- denies chest pain, palpitations RESP- denies SOB, cough, wheeze ABD- denies N/V, change in stools, abd pain GU- denies dysuria, hematuria, dribbling, incontinence MSK- + joint pain, muscle aches, injury Neuro- denies headache, dizziness, syncope, seizure activity       Objective:    BP 132/76 mmHg  Pulse 78  Temp(Src) 97.7 F (36.5 C) (Oral)  Resp 14  Ht 5' (1.524 m)  Wt 253 lb (114.76 kg)  BMI 49.41 kg/m2 GEN- NAD, alert and oriented x3 HEENT- PERRL, EOMI, non injected sclera, pink conjunctiva, MMM, oropharynx clear Neck- Supple, no thyromegaly CVS- RRR, no murmur RESP-CTAB MSK- Mild TTP lumbar spine, neg SLR, fair ROM spine, fair ROM bilat knee, no apparent effusion EXT- pedal edema Pulses- Radial, DP- 2+         Assessment & Plan:      Problem List Items Addressed This Visit    OA (osteoarthritis) - Primary    OA of bilateral knees. I think that she needs intervention may need knee replacement in the future. We'll send her to orthopedics she agrees to have evaluation. She will not take any oral medication but uses topicals.      Relevant Orders   Ambulatory referral to Orthopedic Surgery   Morbid obesity ([place])    Morbidly obese discussed importance of weight loss and how this affects her back pain and knee pain.      Relevant Orders   Ambulatory referral to Orthopedic Surgery   Essential hypertension    Blood pressures a little elevated today however she has not had her diuretic.       Other Visit Diagnoses    Midline low back pain without sciatica        likley has some DDD, with her generalized OA, and her weight. No red flags, can take aleve BID,HEATING PAD       Note: This dictation was prepared with Dragon dictation along with smaller phrase technology. Any transcriptional errors that result from this process are unintentional.

## 2015-06-05 NOTE — Patient Instructions (Signed)
Work diet changes, decrease sodium /salt intake  Referral to Orthopedics  Continue current medications Take aleve for pain  F/U 4 months

## 2015-06-05 NOTE — Assessment & Plan Note (Signed)
OA of bilateral knees. I think that she needs intervention may need knee replacement in the future. We'll send her to orthopedics she agrees to have evaluation. She will not take any oral medication but uses topicals.

## 2015-10-09 ENCOUNTER — Ambulatory Visit (INDEPENDENT_AMBULATORY_CARE_PROVIDER_SITE_OTHER): Payer: BC Managed Care – PPO | Admitting: Family Medicine

## 2015-10-09 ENCOUNTER — Encounter: Payer: Self-pay | Admitting: Family Medicine

## 2015-10-09 VITALS — BP 130/74 | HR 72 | Temp 98.8°F | Resp 14 | Ht 60.0 in | Wt 256.0 lb

## 2015-10-09 DIAGNOSIS — R079 Chest pain, unspecified: Secondary | ICD-10-CM | POA: Diagnosis not present

## 2015-10-09 DIAGNOSIS — I10 Essential (primary) hypertension: Secondary | ICD-10-CM

## 2015-10-09 DIAGNOSIS — R609 Edema, unspecified: Secondary | ICD-10-CM | POA: Diagnosis not present

## 2015-10-09 LAB — COMPREHENSIVE METABOLIC PANEL
ALBUMIN: 3.9 g/dL (ref 3.6–5.1)
ALT: 18 U/L (ref 6–29)
AST: 19 U/L (ref 10–35)
Alkaline Phosphatase: 64 U/L (ref 33–130)
BILIRUBIN TOTAL: 0.8 mg/dL (ref 0.2–1.2)
BUN: 18 mg/dL (ref 7–25)
CHLORIDE: 104 mmol/L (ref 98–110)
CO2: 28 mmol/L (ref 20–31)
CREATININE: 0.85 mg/dL (ref 0.50–0.99)
Calcium: 9.5 mg/dL (ref 8.6–10.4)
Glucose, Bld: 78 mg/dL (ref 70–99)
Potassium: 4.4 mmol/L (ref 3.5–5.3)
SODIUM: 141 mmol/L (ref 135–146)
Total Protein: 7.2 g/dL (ref 6.1–8.1)

## 2015-10-09 LAB — CBC WITH DIFFERENTIAL/PLATELET
Basophils Absolute: 0 cells/uL (ref 0–200)
Basophils Relative: 0 %
EOS PCT: 2 %
Eosinophils Absolute: 116 cells/uL (ref 15–500)
HCT: 39.3 % (ref 35.0–45.0)
HEMOGLOBIN: 12.9 g/dL (ref 12.0–15.0)
LYMPHS ABS: 2378 {cells}/uL (ref 850–3900)
Lymphocytes Relative: 41 %
MCH: 28.4 pg (ref 27.0–33.0)
MCHC: 32.8 g/dL (ref 32.0–36.0)
MCV: 86.6 fL (ref 80.0–100.0)
MONOS PCT: 6 %
MPV: 9.5 fL (ref 7.5–12.5)
Monocytes Absolute: 348 cells/uL (ref 200–950)
NEUTROS ABS: 2958 {cells}/uL (ref 1500–7800)
NEUTROS PCT: 51 %
PLATELETS: 241 10*3/uL (ref 140–400)
RBC: 4.54 MIL/uL (ref 3.80–5.10)
RDW: 15.2 % — ABNORMAL HIGH (ref 11.0–15.0)
WBC: 5.8 10*3/uL (ref 3.8–10.8)

## 2015-10-09 LAB — TSH: TSH: 2.57 mIU/L

## 2015-10-09 NOTE — Assessment & Plan Note (Addendum)
Controlled, but still volume overloaded . EKG also shows a few ectopic beats. Then obtain 2-D echocardiogram. We'll have her take Lasix 40 mg daily with her potassium. Pending results we may need cardiology intervention. I'll hold on beta blocker and to like it more information. I will check labs today including a BNP and she does get some shortness of breath. Continue to encourage dietary changes with her morbid obesity she needs to decrease her sodium intake as well

## 2015-10-09 NOTE — Patient Instructions (Signed)
F/U 2 weeks for recheck We will call with lab results  ECHO to be done

## 2015-10-09 NOTE — Progress Notes (Signed)
   Subjective:    Patient ID: Andrea Eaton, female    DOB: 08-28-1955, 60 y.o.   MRN: [id number]  Patient presents for Follow-up (4 month- is not fasting)  Patient here to follow-up chronic medical problems. Her medications are reviewed. Hypertension she is taking her medication as prescribed her weight unfortunately is up 3 pounds Osteoarthritis she was referred to orthopedics at the last visit- Dr. Lorin Mercy told she needs knee replacements. Had 2 steroids shots done so far with little improvement.   She had another episode of chest tightness, felt flushed and broke out in sweat yesterday AM, did not last long, did not see any care. Occasionally she gets SOB. Her legs have stayed swollen since she returned from a church convention 2 months ago. She is alternating days of 20mg  of lasix and 40mg  of lasix   Review Of Systems:  GEN- denies fatigue, fever, weight loss,weakness, recent illness HEENT- denies eye drainage, change in vision, nasal discharge, CVS-+ chest pain, denies palpitations RESP- + SOB, denies cough, wheeze ABD- denies N/V, change in stools, abd pain GU- denies dysuria, hematuria, dribbling, incontinence MSK-+joint pain, muscle aches, injury Neuro- denies headache, dizziness, syncope, seizure activity       Objective:    BP 130/74 (BP Location: Left Arm, Patient Position: Sitting, Cuff Size: Large)   Pulse 72   Temp 98.8 F (37.1 C) (Oral)   Resp 14   Ht 5' (1.524 m)   Wt 256 lb (116.1 kg)   BMI 50.00 kg/m  GEN- NAD, alert and oriented x3 HEENT- PERRL, EOMI, non injected sclera, pink conjunctiva, MMM, oropharynx clear Neck- Supple, JVD 3cm  CVS- RRR, no murmur RESP-CTAB ABD-NABS,soft,NT,ND EXT- 1+ pitting edema to knees  Pulses- Radial 2+, DP- decreased bilat with swelling   EKG- NSR, no ST changes, few ectopic beats      Assessment & Plan:      Problem List Items Addressed This Visit    Peripheral edema   Relevant Orders   TSH   Brain natriuretic  peptide   Morbid obesity ([place])   Essential hypertension - Primary    Controlled, but still volume overloaded . EKG also shows a few ectopic beats. Then obtain 2-D echocardiogram. We'll have her take Lasix 40 mg daily with her potassium. Pending results we may need cardiology intervention. I'll hold on beta blocker and to like it more information. I will check labs today including a BNP and she does get some shortness of breath. Continue to encourage dietary changes with her morbid obesity she needs to decrease her sodium intake as well      Relevant Orders   CBC with Differential/Platelet   Comprehensive metabolic panel   TSH   EKG 12-Lead (Completed)    Other Visit Diagnoses    Chest pain, unspecified chest pain type       Relevant Orders   Brain natriuretic peptide      Note: This dictation was prepared with Dragon dictation along with smaller phrase technology. Any transcriptional errors that result from this process are unintentional.

## 2015-10-10 LAB — BRAIN NATRIURETIC PEPTIDE: BRAIN NATRIURETIC PEPTIDE: 34.2 pg/mL (ref <100)

## 2015-10-11 ENCOUNTER — Encounter: Payer: Self-pay | Admitting: *Deleted

## 2015-10-24 ENCOUNTER — Ambulatory Visit: Payer: BC Managed Care – PPO | Admitting: Family Medicine

## 2015-11-13 ENCOUNTER — Other Ambulatory Visit: Payer: Self-pay | Admitting: Family Medicine

## 2015-11-24 ENCOUNTER — Telehealth: Payer: Self-pay

## 2015-11-24 NOTE — Telephone Encounter (Signed)
Called lvm for pt to call bck in regards to her referral appt  sch on 11-30-2015

## 2015-11-30 ENCOUNTER — Ambulatory Visit (HOSPITAL_COMMUNITY)
Admission: RE | Admit: 2015-11-30 | Discharge: 2015-11-30 | Disposition: A | Discharge status: Home / Self Care | Payer: BC Managed Care – PPO | Source: Ambulatory Visit | Attending: Family Medicine | Admitting: Family Medicine

## 2015-11-30 DIAGNOSIS — I517 Cardiomegaly: Secondary | ICD-10-CM | POA: Insufficient documentation

## 2015-11-30 DIAGNOSIS — I10 Essential (primary) hypertension: Secondary | ICD-10-CM

## 2015-11-30 DIAGNOSIS — R609 Edema, unspecified: Secondary | ICD-10-CM | POA: Diagnosis not present

## 2015-11-30 DIAGNOSIS — I313 Pericardial effusion (noninflammatory): Secondary | ICD-10-CM | POA: Insufficient documentation

## 2015-11-30 NOTE — Progress Notes (Signed)
*  PRELIMINARY RESULTS* Echocardiogram 2D Echocardiogram has been performed.  Andrea Eaton 11/30/2015, 9:38 AM

## 2015-12-12 ENCOUNTER — Encounter: Payer: Self-pay | Admitting: Family Medicine

## 2016-01-26 ENCOUNTER — Ambulatory Visit: Payer: BC Managed Care – PPO | Admitting: Family Medicine

## 2016-01-30 ENCOUNTER — Ambulatory Visit (INDEPENDENT_AMBULATORY_CARE_PROVIDER_SITE_OTHER): Payer: BC Managed Care – PPO | Admitting: Family Medicine

## 2016-01-30 ENCOUNTER — Encounter: Payer: Self-pay | Admitting: Family Medicine

## 2016-01-30 VITALS — BP 138/78 | HR 100 | Temp 97.9°F | Resp 14 | Ht 60.0 in | Wt 250.0 lb

## 2016-01-30 DIAGNOSIS — I10 Essential (primary) hypertension: Secondary | ICD-10-CM

## 2016-01-30 DIAGNOSIS — R609 Edema, unspecified: Secondary | ICD-10-CM | POA: Diagnosis not present

## 2016-01-30 DIAGNOSIS — Z23 Encounter for immunization: Secondary | ICD-10-CM

## 2016-01-30 DIAGNOSIS — Z1231 Encounter for screening mammogram for malignant neoplasm of breast: Secondary | ICD-10-CM

## 2016-01-30 DIAGNOSIS — Z1239 Encounter for other screening for malignant neoplasm of breast: Secondary | ICD-10-CM

## 2016-01-30 MED ORDER — PHENTERMINE HCL 37.5 MG PO TABS: 37.5000 mg | ORAL_TABLET | Freq: Every day | ORAL | 2 refills | Status: DC

## 2016-01-30 NOTE — Assessment & Plan Note (Signed)
Controlled, no changes. 

## 2016-01-30 NOTE — Assessment & Plan Note (Signed)
Discussed dietary changes, given handouts Trial of phentermine, discussed medication and SE, start with 1/2 tablet daily then titrate up after 2 weeks

## 2016-01-30 NOTE — Assessment & Plan Note (Signed)
Continue lasix, check labs, weight loss will help solve problem Also given compression hose script 20-58mmhg

## 2016-01-30 NOTE — Progress Notes (Signed)
   Subjective:    Patient ID: Andrea Eaton, female    DOB: [birth year], 60 y.o.   MRN: [id number]  Patient presents for BLE Edema (reports that she continues to have edema)   Continues to have LE edema, currentlyon lasix 40mg  She is taking this with potassium. She still gets a lot of swelling in her ankles especially when she is up on her feet. She admits that she is not following a proper diet. Her weight is down 6 pounds. She denies any chest pain no significant shortness of breath currently. She did have an echo done in October which was essentially normal. She is interested in a weight loss medication she is artist seen orthopedics who also recommends that she lose weight before they can do anything for osteoarthritis in her knees. She understands that her leg swelling is also coming from her weight in her diet.  Needs mammogram order  Review Of Systems:  GEN- denies fatigue, fever, weight loss,weakness, recent illness HEENT- denies eye drainage, change in vision, nasal discharge, CVS- denies chest pain, palpitations RESP- denies SOB, cough, wheeze ABD- denies N/V, change in stools, abd pain GU- denies dysuria, hematuria, dribbling, incontinence MSK- denies joint pain, muscle aches, injury Neuro- denies headache, dizziness, syncope, seizure activity       Objective:    BP 138/78 (BP Location: Left Arm, Patient Position: Sitting, Cuff Size: Large)   Pulse 100   Temp 97.9 F (36.6 C) (Oral)   Resp 14   Ht 5' (1.524 m)   Wt 250 lb (113.4 kg)   SpO2 96%   BMI 48.82 kg/m  GEN- NAD, alert and oriented x3 HEENT- PERRL, EOMI, non injected sclera, pink conjunctiva, MMM, oropharynx clear CVS- RRR, no murmur RESP-CTAB ABD-NABS,soft,NT,ND EXT- 1+  edema at bilat ankles  Pulses- Radial 2+, DP- 1+      Assessment & Plan:      Problem List Items Addressed This Visit    Peripheral edema    Continue lasix, check labs, weight loss will help solve problem Also given compression  hose script 20-25mmhg      Relevant Orders   Basic metabolic panel   Morbid obesity ([place]) - Primary    Discussed dietary changes, given handouts Trial of phentermine, discussed medication and SE, start with 1/2 tablet daily then titrate up after 2 weeks      Relevant Medications   phentermine (ADIPEX-P) 37.5 MG tablet   Essential hypertension    Controlled, no changes       Relevant Orders   Basic metabolic panel    Other Visit Diagnoses    Encounter for immunization       Relevant Orders   Flu Vaccine QUAD 36+ mos IM (Completed)   Need for prophylactic vaccination and inoculation against influenza       Relevant Medications   phentermine (ADIPEX-P) 37.5 MG tablet   Breast cancer screening       Relevant Orders   MS DIGITAL SCREENING TOMO BILATERAL      Note: This dictation was prepared with Dragon dictation along with smaller phrase technology. Any transcriptional errors that result from this process are unintentional.

## 2016-01-30 NOTE — Patient Instructions (Addendum)
Try the phentermine for your weight loss, take 1/2 tablet for 2 weeks  Continue the lasix Get the compression hose F/U 2 months

## 2016-01-31 LAB — BASIC METABOLIC PANEL
BUN: 13 mg/dL (ref 7–25)
CALCIUM: 9.3 mg/dL (ref 8.6–10.4)
CO2: 29 mmol/L (ref 20–31)
Chloride: 104 mmol/L (ref 98–110)
Creat: 0.73 mg/dL (ref 0.50–0.99)
GLUCOSE: 71 mg/dL (ref 70–99)
Potassium: 4.5 mmol/L (ref 3.5–5.3)
Sodium: 142 mmol/L (ref 135–146)

## 2016-02-08 ENCOUNTER — Ambulatory Visit (HOSPITAL_COMMUNITY)
Admission: RE | Admit: 2016-02-08 | Discharge: 2016-02-08 | Disposition: A | Discharge status: Home / Self Care | Payer: BC Managed Care – PPO | Source: Ambulatory Visit | Attending: Family Medicine | Admitting: Family Medicine

## 2016-02-08 DIAGNOSIS — Z1231 Encounter for screening mammogram for malignant neoplasm of breast: Secondary | ICD-10-CM | POA: Insufficient documentation

## 2016-02-08 DIAGNOSIS — Z1239 Encounter for other screening for malignant neoplasm of breast: Secondary | ICD-10-CM

## 2016-04-01 ENCOUNTER — Ambulatory Visit: Payer: BC Managed Care – PPO | Admitting: Family Medicine

## 2016-06-12 ENCOUNTER — Other Ambulatory Visit: Payer: Self-pay | Admitting: Family Medicine

## 2016-07-12 ENCOUNTER — Other Ambulatory Visit: Payer: Self-pay | Admitting: Family Medicine

## 2016-08-16 ENCOUNTER — Other Ambulatory Visit: Payer: Self-pay | Admitting: Family Medicine

## 2016-08-16 NOTE — Telephone Encounter (Signed)
Medication filled x1 with no refills.   Requires office visit before any further refills can be given.   Letter sent.  

## 2016-09-10 ENCOUNTER — Ambulatory Visit (INDEPENDENT_AMBULATORY_CARE_PROVIDER_SITE_OTHER): Payer: BC Managed Care – PPO | Admitting: Family Medicine

## 2016-09-10 ENCOUNTER — Encounter: Payer: Self-pay | Admitting: Family Medicine

## 2016-09-10 VITALS — BP 136/72 | HR 72 | Temp 97.9°F | Resp 14 | Ht 60.0 in | Wt 243.0 lb

## 2016-09-10 DIAGNOSIS — I10 Essential (primary) hypertension: Secondary | ICD-10-CM

## 2016-09-10 DIAGNOSIS — R609 Edema, unspecified: Secondary | ICD-10-CM | POA: Diagnosis not present

## 2016-09-10 LAB — COMPREHENSIVE METABOLIC PANEL
ALBUMIN: 3.8 g/dL (ref 3.6–5.1)
ALK PHOS: 69 U/L (ref 33–130)
ALT: 14 U/L (ref 6–29)
AST: 17 U/L (ref 10–35)
BUN: 25 mg/dL (ref 7–25)
CO2: 22 mmol/L (ref 20–31)
Calcium: 9 mg/dL (ref 8.6–10.4)
Chloride: 105 mmol/L (ref 98–110)
Creat: 0.91 mg/dL (ref 0.50–0.99)
Glucose, Bld: 73 mg/dL (ref 70–99)
POTASSIUM: 4.2 mmol/L (ref 3.5–5.3)
Sodium: 140 mmol/L (ref 135–146)
TOTAL PROTEIN: 6.9 g/dL (ref 6.1–8.1)
Total Bilirubin: 0.7 mg/dL (ref 0.2–1.2)

## 2016-09-10 MED ORDER — TORSEMIDE 20 MG PO TABS: 20.0000 mg | ORAL_TABLET | Freq: Every day | ORAL | 1 refills | Status: DC

## 2016-09-10 NOTE — Progress Notes (Signed)
   Subjective:    Patient ID: Andrea Eaton, female    DOB: 01-28-1956, 61 y.o.   MRN: [id number]  Patient presents for Edema (BLE swelling)  Pt here to f/u swelling/htn, taking lasix 40mg  once a day and lisinopril along with potassium. She does not wear compression hose regulary but when she does swelling improves. Weight is down 7lbs since visit in Dec 2017. Denies SOB, chest pain. Swelling has been worst this week so she came in for visit.   Oldest daughter passed away in 07/22/22 and she has been taking care of her 42 year old grandson and husband newly diagnosed with prostate cancer. Tearful discussing this. She has had difficulty sleeping but does not want any medications [name] is very supportive   She is scheduling with [facility] in [place]:  GEN- denies fatigue, fever, weight loss,weakness, recent illness HEENT- denies eye drainage, change in vision, nasal discharge, CVS- denies chest pain, palpitations RESP- denies SOB, cough, wheeze ABD- denies N/V, change in stools, abd pain GU- denies dysuria, hematuria, dribbling, incontinence MSK- +joint pain, Denies muscle aches, injury Neuro- denies headache, dizziness, syncope, seizure activity       Objective:    BP 136/72   Pulse 72   Temp 97.9 F (36.6 C) (Oral)   Resp 14   Ht 5' (1.524 m)   Wt 243 lb (110.2 kg)   SpO2 98%   BMI 47.46 kg/m  GEN- NAD, alert and oriented x3 HEENT- PERRL, EOMI, non injected sclera, pink conjunctiva, MMM, oropharynx clear Neck- Supple, no JVD CVS- RRR, no murmur RESP-CTAB ABD-NABS,soft,NT,ND EXT- 2+ pitting  edema R >L Psych- tearful, not anxious, no SI, well groomed  Pulses- Radial 2+, DP- 1+        Assessment & Plan:      Problem List Items Addressed This Visit    Morbid obesity ([place])   Peripheral edema - Primary    Try Demadex 20mg , continue potassum Check renal function today  F/U next Friday for recheck with physical  Wear compression hose Normal  Echo 2017      Relevant Orders   Comprehensive metabolic panel   Essential hypertension    Controlled, keep hydrated with medications      Relevant Medications   torsemide (DEMADEX) 20 MG tablet      Note: This dictation was prepared with Dragon dictation along with smaller phrase technology. Any transcriptional errors that result from this process are unintentional.

## 2016-09-10 NOTE — Assessment & Plan Note (Signed)
Controlled, keep hydrated with medications

## 2016-09-10 NOTE — Patient Instructions (Addendum)
Stop the lasix Start the toresemide once a day with potasium Wear compression hose We will call with lab results  F/U Friday August 3rd put in for PHYSICAL AT 12PM

## 2016-09-10 NOTE — Assessment & Plan Note (Signed)
Try Demadex 20mg , continue potassum Check renal function today  F/U next Friday for recheck with physical  Wear compression hose Normal Echo 2017

## 2016-09-20 ENCOUNTER — Ambulatory Visit (INDEPENDENT_AMBULATORY_CARE_PROVIDER_SITE_OTHER): Payer: BC Managed Care – PPO | Admitting: Family Medicine

## 2016-09-20 ENCOUNTER — Encounter: Payer: Self-pay | Admitting: Family Medicine

## 2016-09-20 ENCOUNTER — Other Ambulatory Visit: Payer: Self-pay | Admitting: Family Medicine

## 2016-09-20 VITALS — BP 130/82 | HR 64 | Temp 97.6°F | Resp 18 | Ht 60.0 in | Wt 235.0 lb

## 2016-09-20 DIAGNOSIS — R609 Edema, unspecified: Secondary | ICD-10-CM | POA: Diagnosis not present

## 2016-09-20 DIAGNOSIS — Z1211 Encounter for screening for malignant neoplasm of colon: Secondary | ICD-10-CM

## 2016-09-20 DIAGNOSIS — Z0000 Encounter for general adult medical examination without abnormal findings: Secondary | ICD-10-CM | POA: Diagnosis not present

## 2016-09-20 DIAGNOSIS — Z1159 Encounter for screening for other viral diseases: Secondary | ICD-10-CM | POA: Diagnosis not present

## 2016-09-20 DIAGNOSIS — B372 Candidiasis of skin and nail: Secondary | ICD-10-CM

## 2016-09-20 DIAGNOSIS — R195 Other fecal abnormalities: Secondary | ICD-10-CM

## 2016-09-20 DIAGNOSIS — R600 Localized edema: Secondary | ICD-10-CM

## 2016-09-20 DIAGNOSIS — Z124 Encounter for screening for malignant neoplasm of cervix: Secondary | ICD-10-CM | POA: Diagnosis not present

## 2016-09-20 LAB — HEMOCCULT GUIAC POC 1CARD (OFFICE): Fecal Occult Blood, POC: POSITIVE — AB

## 2016-09-20 LAB — CBC WITH DIFFERENTIAL/PLATELET
Basophils Absolute: 0 cells/uL (ref 0–200)
Basophils Relative: 0 %
EOS ABS: 112 {cells}/uL (ref 15–500)
Eosinophils Relative: 2 %
HEMATOCRIT: 41.3 % (ref 35.0–45.0)
Hemoglobin: 13.8 g/dL (ref 12.0–15.0)
LYMPHS PCT: 51 %
Lymphs Abs: 2856 cells/uL (ref 850–3900)
MCH: 29.1 pg (ref 27.0–33.0)
MCHC: 33.4 g/dL (ref 32.0–36.0)
MCV: 87.1 fL (ref 80.0–100.0)
MONO ABS: 392 {cells}/uL (ref 200–950)
MPV: 9.6 fL (ref 7.5–12.5)
Monocytes Relative: 7 %
NEUTROS PCT: 40 %
Neutro Abs: 2240 cells/uL (ref 1500–7800)
PLATELETS: 272 10*3/uL (ref 140–400)
RBC: 4.74 MIL/uL (ref 3.80–5.10)
RDW: 14.6 % (ref 11.0–15.0)
WBC: 5.6 10*3/uL (ref 3.8–10.8)

## 2016-09-20 MED ORDER — CLOTRIMAZOLE 1 % EX CREA: 1.0000 "application " | TOPICAL_CREAM | Freq: Two times a day (BID) | CUTANEOUS | 0 refills | Status: DC

## 2016-09-20 NOTE — Assessment & Plan Note (Signed)
Much improved Will hold lisinopril as she felt weak and dizzy taking both Continue diuretic with potassium Recheck labs Continue compression hose

## 2016-09-20 NOTE — Progress Notes (Signed)
Subjective:    Patient ID: Andrea Eaton, female    DOB: [birth year], 61 y.o.   MRN: [id number]  Patient presents for Annual Exam   Pt here for CPE and recheck on peripheral edema  Seen 7/24 changed to demadex 20MG  DAILY with potassium, weight was 243lbs, today weight down 8lbs  Also advised to wear compression hose. She held the lisinopril when she started the water pill she was worried about the blood pressure dropping   legs are much better    Had cookie this morning and yogurt   Has a spot on left lower abdomen, that flaresup , crackin skin beneath her pannus, uses Triple antibiotic ointment on it   Colonoscopy- UTD in  2009 had diverticulosis and internal hemorroids Mammogram- UTD  Immunizations- Due for shingles vaccine - declines  Cervical cancer- last PAP Normal in 2014, due for repeat  Discussed Hep C and HIV screening      Review Of Systems:  GEN- denies fatigue, fever, weight loss,weakness, recent illness HEENT- denies eye drainage, change in vision, nasal discharge, CVS- denies chest pain, palpitations RESP- denies SOB, cough, wheeze ABD- denies N/V, change in stools, abd pain GU- denies dysuria, hematuria, dribbling, incontinence MSK- denies joint pain, muscle aches, injury Neuro- denies headache, dizziness, syncope, seizure activity       Objective:    BP 130/82   Pulse 64   Temp 97.6 F (36.4 C)   Resp 18   Ht 5' (1.524 m)   Wt 235 lb (106.6 kg)   SpO2 99%   BMI 45.90 kg/m  GEN- NAD, alert and oriented x3 HEENT- PERRL, EOMI, non injected sclera, pink conjunctiva, MMM, oropharynx clear Neck- Supple, no thyromegaly Breast- normal symmetry, no nipple inversion,no nipple drainage, no nodules or lumps felt Nodes- no axillary nodes CVS- RRR, no murmur RESP-CTAB ABD-NABS,soft,NT,ND Skin- moisturize beneath pannus, crack in skin, mild TTP, no discharge GU- normal external genitalia, vaginal mucosa pink and moist, cervix visualized smal cyst on  cervix, mild  Bleeding at os with PAP brush , no discharge , no CMT, no ovarian masses, uterus normal size Rectum- normal tone FOBT positive  EXT- pedal edema Pulses- Radial 1+, DP- 1+        Assessment & Plan:      Problem List Items Addressed This Visit    Morbid obesity ([place])   Routine general medical examination at a health care facility - Primary    CPE done, continue to work on  Nutrition Declines shingles vaccine Hep C screen done Declines HV test PAP Smear done       Relevant Orders   Comprehensive metabolic panel (Completed)   CBC with Differential/Platelet (Completed)   Lipid panel (Completed)   POCT occult blood stool (Completed)   Peripheral edema    Much improved Will hold lisinopril as she felt weak and dizzy taking both Continue diuretic with potassium Recheck labs Continue compression hose       Relevant Orders   Comprehensive metabolic panel (Completed)    Other Visit Diagnoses    Need for hepatitis C screening test       Relevant Orders   Hepatitis C antibody (Completed)   Cervical cancer screening       Relevant Orders   PAP, ThinPrep ASCUS Rflx HPV Rflx Type   Intertriginous candidiasis       topical anti-fungal given, can stop antibiotic oitnment can also use Gold bonds   Relevant Medications   clotrimazole (LOTRIMIN) 1 %  cream   Heme positive stool       Has hemorroid history but close to 10 year span for colonoscopy any way   Relevant Orders   Ambulatory referral to Gastroenterology   Colon cancer screening       Relevant Orders   Ambulatory referral to Gastroenterology      Note: This dictation was prepared with Dragon dictation along with smaller phrase technology. Any transcriptional errors that result from this process are unintentional.

## 2016-09-20 NOTE — Patient Instructions (Addendum)
We will call with results Continue the demadex Hold the lisinopril for now  Use cream twice a day  Referral for colonoscopy F/U 4 months

## 2016-09-20 NOTE — Assessment & Plan Note (Signed)
CPE done, continue to work on  Nutrition Declines shingles vaccine Hep C screen done Declines HV test PAP Smear done

## 2016-09-21 LAB — COMPREHENSIVE METABOLIC PANEL
ALT: 14 U/L (ref 6–29)
AST: 18 U/L (ref 10–35)
Albumin: 3.9 g/dL (ref 3.6–5.1)
Alkaline Phosphatase: 75 U/L (ref 33–130)
BUN: 27 mg/dL — AB (ref 7–25)
CHLORIDE: 101 mmol/L (ref 98–110)
CO2: 24 mmol/L (ref 20–31)
CREATININE: 0.95 mg/dL (ref 0.50–0.99)
Calcium: 9.5 mg/dL (ref 8.6–10.4)
Glucose, Bld: 82 mg/dL (ref 70–99)
Potassium: 4 mmol/L (ref 3.5–5.3)
Sodium: 139 mmol/L (ref 135–146)
TOTAL PROTEIN: 7.4 g/dL (ref 6.1–8.1)
Total Bilirubin: 0.7 mg/dL (ref 0.2–1.2)

## 2016-09-21 LAB — LIPID PANEL
CHOL/HDL RATIO: 2.1 ratio (ref <5.0)
Cholesterol: 164 mg/dL (ref <200)
HDL: 80 mg/dL (ref >50)
LDL Cholesterol: 72 mg/dL (ref <100)
TRIGLYCERIDES: 60 mg/dL (ref <150)
VLDL: 12 mg/dL (ref <30)

## 2016-09-21 LAB — HEPATITIS C ANTIBODY: HCV Ab: NONREACTIVE

## 2016-09-24 LAB — PAP THINPREP ASCUS RFLX HPV RFLX TYPE

## 2016-09-27 LAB — HPV DNA, RELFEX TYPE 16/18: HPV DNA High Risk: NOT DETECTED

## 2016-09-29 ENCOUNTER — Other Ambulatory Visit: Payer: Self-pay | Admitting: Family Medicine

## 2016-11-07 ENCOUNTER — Other Ambulatory Visit: Payer: Self-pay | Admitting: Family Medicine

## 2016-11-12 ENCOUNTER — Other Ambulatory Visit: Payer: Self-pay | Admitting: Family Medicine

## 2017-01-20 ENCOUNTER — Other Ambulatory Visit: Payer: Self-pay

## 2017-01-20 ENCOUNTER — Ambulatory Visit: Payer: BC Managed Care – PPO | Admitting: Family Medicine

## 2017-01-20 ENCOUNTER — Encounter: Payer: Self-pay | Admitting: Family Medicine

## 2017-01-20 VITALS — BP 132/78 | HR 66 | Temp 97.7°F | Resp 16 | Ht 60.0 in | Wt 253.0 lb

## 2017-01-20 DIAGNOSIS — I10 Essential (primary) hypertension: Secondary | ICD-10-CM

## 2017-01-20 DIAGNOSIS — R21 Rash and other nonspecific skin eruption: Secondary | ICD-10-CM | POA: Diagnosis not present

## 2017-01-20 DIAGNOSIS — J069 Acute upper respiratory infection, unspecified: Secondary | ICD-10-CM | POA: Diagnosis not present

## 2017-01-20 DIAGNOSIS — R609 Edema, unspecified: Secondary | ICD-10-CM

## 2017-01-20 MED ORDER — GUAIFENESIN-CODEINE 100-10 MG/5ML PO SOLN: 5.0000 mL | Freq: Four times a day (QID) | ORAL | 0 refills | Status: DC | PRN

## 2017-01-20 MED ORDER — AZITHROMYCIN 250 MG PO TABS: ORAL_TABLET | ORAL | 0 refills | Status: DC

## 2017-01-20 NOTE — Assessment & Plan Note (Signed)
Discussed taken her water pill wearing some type of compression hose.

## 2017-01-20 NOTE — Patient Instructions (Addendum)
Take antibiotics Use nasal spray  Cough medicine given Take water pill  Get flu shot at pharmacy in about 1 week  F/U 3 months

## 2017-01-20 NOTE — Assessment & Plan Note (Signed)
Discussed dietary changes.  Short-term weight loss goal of at least 10-15 pounds by next visit.  Cut out fast food increase her activity

## 2017-01-20 NOTE — Progress Notes (Signed)
   Subjective:    Patient ID: Andrea Eaton, female    DOB: 1955/07/19, 61 y.o.   MRN: [id number]  Patient presents for 4 month F/U (is not fasting) and Illness (x4 days- productive cough with cleaar to blood tinged sputum, nasal congestion, hoarse)  Cough with production for past 4-5 days, had blood tinged sputum this AM. Has heard ratteling, nasal congesiton, hoares voice, taken nyquil and dayquil Has been fatigued. Had diarrhea 1st day, none since.  Drinking fluids, orange juice, gingerale    Obesity- weight up 18lbs , had crackers and tootsie rolls this AM, has not been very active, husband going through treatments, she snacks all the time   Peripheral edema- she has not taken water pill past 2 days, Sunday because she was at church    Review Of Systems:  GEN- denies fatigue, fever, weight loss,weakness, recent illness HEENT- denies eye drainage, change in vision, nasal discharge, CVS- denies chest pain, palpitations RESP- denies SOB, +cough, wheeze ABD- denies N/V, change in stools, abd pain GU- denies dysuria, hematuria, dribbling, incontinence MSK- denies joint pain, muscle aches, injury Neuro- denies headache, dizziness, syncope, seizure activity       Objective:    BP 132/78   Pulse 66   Temp 97.7 F (36.5 C) (Oral)   Resp 16   Ht 5' (1.524 m)   Wt 253 lb (114.8 kg)   SpO2 99%   BMI 49.41 kg/m  GEN- NAD, alert and oriented x3 HEENT- PERRL, EOMI, non injected sclera, pink conjunctiva, MMM, oropharynx clear,naers clear rhinorrhea, TM clear bila  Neck- Supple, no LAD CVS- RRR, no murmur RESP-mild rhonchi, bilat no wheeze, normal WOB  ABD-NABS,soft,NT,ND EXT- 1+ pedal edema Pulses- Radial, DP- 2+ And she had a couple different scabs one on her left lower leg one on chest wall one on face no erythema no pustules noted        Assessment & Plan:      Problem List Items Addressed This Visit      Unprioritized   Peripheral edema    Discussed taken her  water pill wearing some type of compression hose.      Morbid obesity ([place]) - Primary    Discussed dietary changes.  Short-term weight loss goal of at least 10-15 pounds by next visit.  Cut out fast food increase her activity      Essential hypertension    Blood pressure looks okay.       Other Visit Diagnoses    Acute URI       Treat with azithromycin and Robitussin codeine Nasacort   Relevant Medications   azithromycin (ZITHROMAX) 250 MG tablet   Skin rash       In the visit showed me a couple of different scabs that they kept popping up in various places possible small staph infections that she keeps transferring.  Given antibiotic ointment.      Note: This dictation was prepared with Dragon dictation along with smaller phrase technology. Any transcriptional errors that result from this process are unintentional.

## 2017-01-20 NOTE — Assessment & Plan Note (Signed)
Blood pressure looks okay 

## 2017-02-01 ENCOUNTER — Other Ambulatory Visit: Payer: Self-pay | Admitting: Family Medicine

## 2017-04-14 ENCOUNTER — Other Ambulatory Visit: Payer: Self-pay | Admitting: Family Medicine

## 2017-04-23 ENCOUNTER — Ambulatory Visit: Payer: BC Managed Care – PPO | Admitting: Family Medicine

## 2017-04-29 ENCOUNTER — Ambulatory Visit: Payer: BC Managed Care – PPO | Admitting: Family Medicine

## 2017-04-29 ENCOUNTER — Other Ambulatory Visit: Payer: Self-pay

## 2017-04-29 ENCOUNTER — Encounter: Payer: Self-pay | Admitting: Family Medicine

## 2017-04-29 VITALS — BP 122/64 | HR 70 | Temp 97.9°F | Resp 12 | Ht 60.0 in | Wt 255.0 lb

## 2017-04-29 DIAGNOSIS — K5900 Constipation, unspecified: Secondary | ICD-10-CM | POA: Insufficient documentation

## 2017-04-29 DIAGNOSIS — R609 Edema, unspecified: Secondary | ICD-10-CM

## 2017-04-29 DIAGNOSIS — I10 Essential (primary) hypertension: Secondary | ICD-10-CM

## 2017-04-29 DIAGNOSIS — K5904 Chronic idiopathic constipation: Secondary | ICD-10-CM | POA: Diagnosis not present

## 2017-04-29 DIAGNOSIS — H6121 Impacted cerumen, right ear: Secondary | ICD-10-CM

## 2017-04-29 DIAGNOSIS — R600 Localized edema: Secondary | ICD-10-CM

## 2017-04-29 NOTE — Progress Notes (Signed)
   Subjective:    Patient ID: Andrea Eaton, female    DOB: 11/06/1955, 62 y.o.   MRN: [id number]  Patient presents for Follow-up (is fasting)    Constipation- has been straining her bowels for the past 2-3 months , has been trying to eat more fiber but also using coffee to help move them . Has not noticed any blood in stool       Right ear pain, no sinus pressure or drainage no recent illness. No drainage from ear, hearing in tact    Peripheral edema- taking demadex most days   HTN- on diuretic    Obesity- not eating regularly, mostly snacking an regained 20lbs, stopped weight watchers Gets pain into her legs with standing.   Review Of Systems:  GEN- denies fatigue, fever, weight loss,weakness, recent illness HEENT- denies eye drainage, change in vision, nasal discharge, CVS- denies chest pain, palpitations RESP- denies SOB, cough, wheeze ABD- denies N/V, change in stools, abd pain GU- denies dysuria, hematuria, dribbling, incontinence MSK- + joint pain, muscle aches, injury Neuro- denies headache, dizziness, syncope, seizure activity       Objective:    BP 122/64   Pulse 70   Temp 97.9 F (36.6 C) (Oral)   Resp 12   Ht 5' (1.524 m)   Wt 255 lb (115.7 kg)   SpO2 97%   BMI 49.80 kg/m  GEN- NAD, alert and oriented x3 HEENT- PERRL, EOMI, non injected sclera, pink conjunctiva, MMM, oropharynx clear, right canal impacted wax, left TM clear, canal clear  Neck- Supple, no thyromegaly CVS- RRR, no murmur RESP-CTAB ABD-NABS,soft,NT,ND MSK- Fair ROM SPINE, KNEES, Neg SLR, no effusion EXT- Chronic ankle edema Pulses- Radial, DP- 2+        Assessment & Plan:      Problem List Items Addressed This Visit      Unprioritized   Peripheral edema   Morbid obesity ([place])    Continue to reiterate importance of weight loss Risk of heart disease, DM, chronic pain to joints which she already has some of Worsening edema      Essential hypertension - Primary   Controlled, peripheral edema, looks pretty good today Continue demadex      Relevant Orders   CBC with Differential/Platelet   Basic metabolic panel   Constipation    Given samples of Miralax daily Continue with fiber, water       Other Visit Diagnoses    Right ear impacted cerumen       s/p irrigation, improved symptoms      Note: This dictation was prepared with Dragon dictation along with smaller phrase technology. Any transcriptional errors that result from this process are unintentional.

## 2017-04-29 NOTE — Patient Instructions (Addendum)
Drink more water Use miralax once a day for constipation  F/U 4 months

## 2017-04-30 ENCOUNTER — Encounter: Payer: Self-pay | Admitting: Family Medicine

## 2017-04-30 NOTE — Assessment & Plan Note (Signed)
Controlled, peripheral edema, looks pretty good today Continue demadex

## 2017-04-30 NOTE — Assessment & Plan Note (Signed)
Continue to reiterate importance of weight loss Risk of heart disease, DM, chronic pain to joints which she already has some of Worsening edema

## 2017-04-30 NOTE — Assessment & Plan Note (Addendum)
Given samples of Miralax daily Continue with fiber, water

## 2017-06-03 LAB — CBC WITH DIFFERENTIAL/PLATELET
Basophils Absolute: 19 cells/uL (ref 0–200)
Basophils Relative: 0.4 %
Eosinophils Absolute: 91 cells/uL (ref 15–500)
Eosinophils Relative: 1.9 %
HCT: 39.1 % (ref 35.0–45.0)
Hemoglobin: 13.4 g/dL (ref 11.7–15.5)
LYMPHS ABS: 2270 {cells}/uL (ref 850–3900)
MCH: 28.6 pg (ref 27.0–33.0)
MCHC: 34.3 g/dL (ref 32.0–36.0)
MCV: 83.4 fL (ref 80.0–100.0)
MONOS PCT: 6.8 %
MPV: 10.1 fL (ref 7.5–12.5)
NEUTROS PCT: 43.6 %
Neutro Abs: 2093 cells/uL (ref 1500–7800)
PLATELETS: 265 10*3/uL (ref 140–400)
RBC: 4.69 10*6/uL (ref 3.80–5.10)
RDW: 13.9 % (ref 11.0–15.0)
TOTAL LYMPHOCYTE: 47.3 %
WBC mixed population: 326 cells/uL (ref 200–950)
WBC: 4.8 10*3/uL (ref 3.8–10.8)

## 2017-06-03 LAB — BASIC METABOLIC PANEL
BUN: 22 mg/dL (ref 7–25)
CO2: 27 mmol/L (ref 20–32)
CREATININE: 0.81 mg/dL (ref 0.50–0.99)
Calcium: 9.2 mg/dL (ref 8.6–10.4)
Chloride: 105 mmol/L (ref 98–110)
GLUCOSE: 86 mg/dL (ref 65–139)
Potassium: 4.2 mmol/L (ref 3.5–5.3)
Sodium: 141 mmol/L (ref 135–146)

## 2017-06-04 ENCOUNTER — Encounter: Payer: Self-pay | Admitting: *Deleted

## 2017-07-09 ENCOUNTER — Encounter: Payer: Self-pay | Admitting: Gastroenterology

## 2017-09-01 ENCOUNTER — Other Ambulatory Visit: Payer: Self-pay

## 2017-09-01 ENCOUNTER — Ambulatory Visit: Payer: BC Managed Care – PPO | Admitting: Family Medicine

## 2017-09-01 ENCOUNTER — Encounter: Payer: Self-pay | Admitting: Family Medicine

## 2017-09-01 VITALS — BP 130/82 | HR 68 | Temp 98.3°F | Resp 14 | Ht 60.0 in | Wt 248.0 lb

## 2017-09-01 DIAGNOSIS — I10 Essential (primary) hypertension: Secondary | ICD-10-CM

## 2017-09-01 DIAGNOSIS — Z1211 Encounter for screening for malignant neoplasm of colon: Secondary | ICD-10-CM

## 2017-09-01 DIAGNOSIS — R609 Edema, unspecified: Secondary | ICD-10-CM | POA: Diagnosis not present

## 2017-09-01 DIAGNOSIS — R42 Dizziness and giddiness: Secondary | ICD-10-CM

## 2017-09-01 LAB — CBC WITH DIFFERENTIAL/PLATELET
BASOS ABS: 31 {cells}/uL (ref 0–200)
Basophils Relative: 0.6 %
EOS PCT: 1.3 %
Eosinophils Absolute: 68 cells/uL (ref 15–500)
HEMATOCRIT: 42 % (ref 35.0–45.0)
HEMOGLOBIN: 14.2 g/dL (ref 11.7–15.5)
Lymphs Abs: 2288 cells/uL (ref 850–3900)
MCH: 28.3 pg (ref 27.0–33.0)
MCHC: 33.8 g/dL (ref 32.0–36.0)
MCV: 83.7 fL (ref 80.0–100.0)
MONOS PCT: 8.1 %
MPV: 10.4 fL (ref 7.5–12.5)
NEUTROS ABS: 2392 {cells}/uL (ref 1500–7800)
Neutrophils Relative %: 46 %
Platelets: 280 10*3/uL (ref 140–400)
RBC: 5.02 10*6/uL (ref 3.80–5.10)
RDW: 14.1 % (ref 11.0–15.0)
Total Lymphocyte: 44 %
WBC mixed population: 421 cells/uL (ref 200–950)
WBC: 5.2 10*3/uL (ref 3.8–10.8)

## 2017-09-01 LAB — COMPREHENSIVE METABOLIC PANEL
AG RATIO: 1.2 (calc) (ref 1.0–2.5)
ALBUMIN MSPROF: 4 g/dL (ref 3.6–5.1)
ALT: 15 U/L (ref 6–29)
AST: 20 U/L (ref 10–35)
Alkaline phosphatase (APISO): 78 U/L (ref 33–130)
BUN: 23 mg/dL (ref 7–25)
CO2: 25 mmol/L (ref 20–32)
CREATININE: 0.96 mg/dL (ref 0.50–0.99)
Calcium: 9.6 mg/dL (ref 8.6–10.4)
Chloride: 102 mmol/L (ref 98–110)
GLOBULIN: 3.4 g/dL (ref 1.9–3.7)
GLUCOSE: 86 mg/dL (ref 65–99)
Potassium: 3.8 mmol/L (ref 3.5–5.3)
SODIUM: 140 mmol/L (ref 135–146)
TOTAL PROTEIN: 7.4 g/dL (ref 6.1–8.1)
Total Bilirubin: 1.2 mg/dL (ref 0.2–1.2)

## 2017-09-01 LAB — LIPID PANEL
CHOL/HDL RATIO: 2.4 (calc) (ref <5.0)
Cholesterol: 155 mg/dL (ref <200)
HDL: 65 mg/dL (ref >50)
LDL Cholesterol (Calc): 76 mg/dL (calc)
NON-HDL CHOLESTEROL (CALC): 90 mg/dL (ref <130)
Triglycerides: 55 mg/dL (ref <150)

## 2017-09-01 NOTE — Progress Notes (Signed)
   Subjective:    Patient ID: Andrea Eaton, female    DOB: 1956-02-04, 62 y.o.   MRN: [id number]  Patient presents for Follow-up (is not fasting)   States she gets fatigue and dizzy spells in the afternoons,  Drinks water spells go away, often eats veyr little in the morning or at lunch too But weight is down 7lbs since last visit in march   Peripheral edema- mostly in right ankle, taking demadex , she does not wear compression hose as prescribed  Has sore spot on right leg, has a small knot, initially had some redeness, not sure how long it was present, no longer sore in that spot    Chronic knee pain- seen orthopedics about 2 years ago , does not take any OTC meds      Review Of Systems:  GEN- denies fatigue, fever, weight loss,weakness, recent illness HEENT- denies eye drainage, change in vision, nasal discharge, CVS- denies chest pain, palpitations RESP- denies SOB, cough, wheeze ABD- denies N/V, change in stools, abd pain GU- denies dysuria, hematuria, dribbling, incontinence MSK- denies joint pain, muscle aches, injury Neuro- denies headache,+ dizziness, syncope, seizure activity       Objective:    BP 130/82   Pulse 68   Temp 98.3 F (36.8 C) (Oral)   Resp 14   Ht 5' (1.524 m)   Wt 248 lb (112.5 kg)   SpO2 97%   BMI 48.43 kg/m  GEN- NAD, alert and oriented x3 HEENT- PERRL, EOMI, non injected sclera, pink conjunctiva, MMM, oropharynx clear Neck- Supple, no thyromegaly, no bruit CVS- RRR, no murmur RESP-CTAB ABD-NABS,soft,NT,ND EXT- Pedal/ankle edema R> L  Neuro-CNII-XII in tact, no deficits  Pulses- Radial, DP- 2+        Assessment & Plan:      Problem List Items Addressed This Visit      Unprioritized   Dizziness    Appears to be associated with lower glucose, not eating well Also has had when hypotensive, so I dont want to increase her diuiretic and prefer she wear the compression hose Check labs as well Discussed eating 3 meals, no snacks,  especially at night      Relevant Orders   CBC with Differential/Platelet (Completed)   Essential hypertension    Well controlled      Relevant Orders   Comprehensive metabolic panel   Lipid panel   CBC with Differential/Platelet (Completed)   Morbid obesity ([place])   Relevant Orders   Comprehensive metabolic panel   Peripheral edema - Primary    Discussed need for use of compression hose      Relevant Orders   CBC with Differential/Platelet (Completed)    Other Visit Diagnoses    Colon cancer screening       send new referral, needs closer to home due to transportation   Relevant Orders   Ambulatory referral to Gastroenterology      Note: This dictation was prepared with Dragon dictation along with smaller phrase technology. Any transcriptional errors that result from this process are unintentional.

## 2017-09-01 NOTE — Assessment & Plan Note (Signed)
Appears to be associated with lower glucose, not eating well Also has had when hypotensive, so I dont want to increase her diuiretic and prefer she wear the compression hose Check labs as well Discussed eating 3 meals, no snacks, especially at night

## 2017-09-01 NOTE — Assessment & Plan Note (Signed)
Discussed need for use of compression hose

## 2017-09-01 NOTE — Assessment & Plan Note (Signed)
Well controlled 

## 2017-09-01 NOTE — Patient Instructions (Signed)
We will call with lab results  Wear compression hose We will check on the GI referral  F/U 6 months for physical

## 2017-09-02 ENCOUNTER — Other Ambulatory Visit: Payer: Self-pay | Admitting: Family Medicine

## 2017-09-02 DIAGNOSIS — R195 Other fecal abnormalities: Secondary | ICD-10-CM

## 2017-09-12 ENCOUNTER — Encounter: Payer: Self-pay | Admitting: Gastroenterology

## 2017-12-22 ENCOUNTER — Other Ambulatory Visit: Payer: Self-pay

## 2017-12-22 ENCOUNTER — Encounter: Payer: Self-pay | Admitting: Gastroenterology

## 2017-12-22 ENCOUNTER — Ambulatory Visit (INDEPENDENT_AMBULATORY_CARE_PROVIDER_SITE_OTHER): Payer: BC Managed Care – PPO | Admitting: Gastroenterology

## 2017-12-22 DIAGNOSIS — R195 Other fecal abnormalities: Secondary | ICD-10-CM | POA: Insufficient documentation

## 2017-12-22 NOTE — Progress Notes (Signed)
CC'D TO PCP °

## 2017-12-22 NOTE — Assessment & Plan Note (Signed)
62 year old female presenting to schedule colonoscopy.  She had heme positive stools last year, occasional bright red blood per rectum, likely benign anorectal source such as hemorrhoids.  Recommend colonoscopy in the near future.  I have discussed the risks, alternatives, benefits with regards to but not limited to the risk of reaction to medication, bleeding, infection, perforation and the patient is agreeable to proceed. Written consent to be obtained.

## 2017-12-22 NOTE — Progress Notes (Addendum)
Primary Care Physician:  Alycia Rossetti, MD  Primary Gastroenterologist:  Barney Drain, MD  REVIEWED. TCS FOR HEME POS STOOLS.  Chief Complaint  Patient presents with  . Colonoscopy    HPI:  Andrea Eaton is a 62 y.o. female here at the request of Dr. Buelah Manis to schedule colonoscopy.  She was Hemoccult positive last year but never followed through with getting a colonoscopy.  Last colonoscopy was in 2009 Dr. Fuller Plan as outlined below.  Chronically she has a bowel movement about every other day.  Occasional straining associated with bright red blood per rectum which she feels is related to her known hemorrhoids.  Denies any abdominal pain, heartburn, dysphagia, vomiting.  No family history of colon cancer.  She manages her mild constipation by increasing her dietary fiber.  Current Outpatient Medications  Medication Sig Dispense Refill  . Multiple Vitamins-Minerals (CENTRUM SILVER ULTRA WOMENS) TABS Take 1 tablet by mouth daily.    . Potassium (POTASSIMIN PO) Take by mouth. otc    . torsemide (DEMADEX) 20 MG tablet TAKE 1 TABLET BY MOUTH EVERY DAY 90 tablet 1   No current facility-administered medications for this visit.     Allergies as of 12/22/2017  . (No Known Allergies)    Past Medical History:  Diagnosis Date  . Arthritis    Dr. Sharol Given  . Hypertension     Past Surgical History:  Procedure Laterality Date  . COLONOSCOPY  07/2007   Dr. Fuller Plan: diverticulosis, internal hemorrhoids  . ESOPHAGOGASTRODUODENOSCOPY  07/2007   Dr. Fuller Plan: H.pylori gastritis    Family History  Problem Relation Age of Onset  . Hypertension Mother   . Diabetes Mother   . Hypertension Father   . Heart disease Father   . Diabetes Sister   . Hypertension Sister   . Hyperlipidemia Sister   . Heart disease Sister   . Hypertension Brother   . Diabetes Brother   . Colon cancer Neg Hx     Social History   Socioeconomic History  . Marital status: Married    Spouse name: Not on file  .  Number of children: Not on file  . Years of education: Not on file  . Highest education level: Not on file  Occupational History  . Not on file  Social Needs  . Financial resource strain: Not on file  . Food insecurity:    Worry: Not on file    Inability: Not on file  . Transportation needs:    Medical: Not on file    Non-medical: Not on file  Tobacco Use  . Smoking status: Never Smoker  . Smokeless tobacco: Never Used  Substance and Sexual Activity  . Alcohol use: No  . Drug use: No  . Sexual activity: Never  Lifestyle  . Physical activity:    Days per week: Not on file    Minutes per session: Not on file  . Stress: Not on file  Relationships  . Social connections:    Talks on phone: Not on file    Gets together: Not on file    Attends religious service: Not on file    Active member of club or organization: Not on file    Attends meetings of clubs or organizations: Not on file    Relationship status: Not on file  . Intimate partner violence:    Fear of current or ex partner: Not on file    Emotionally abused: Not on file    Physically abused: Not on  file    Forced sexual activity: Not on file  Other Topics Concern  . Not on file  Social History Narrative  . Not on file      ROS:  General: Negative for anorexia, weight loss, fever, chills, fatigue, weakness. Eyes: Negative for vision changes.  ENT: Negative for hoarseness, difficulty swallowing , nasal congestion. CV: Negative for chest pain, angina, palpitations, dyspnea on exertion, peripheral edema.  Respiratory: Negative for dyspnea at rest, dyspnea on exertion, cough, sputum, wheezing.  GI: See history of present illness. GU:  Negative for dysuria, hematuria, urinary incontinence, urinary frequency, nocturnal urination.  MS: Positive joint pain, negative low back pain.  Derm: Negative for rash or itching.  Neuro: Negative for weakness, abnormal sensation, seizure, frequent headaches, memory loss,  confusion.  Psych: Negative for anxiety, depression, suicidal ideation, hallucinations.  Endo: Negative for unusual weight change.  Heme: Negative for bruising or bleeding. Allergy: Negative for rash or hives.    Physical Examination:  BP (!) 143/82   Pulse 77   Temp (!) 96.6 F (35.9 C) (Oral)   Ht 5' 2.5" (1.588 m)   Wt 252 lb 9.6 oz (114.6 kg)   BMI 45.46 kg/m    General: Well-nourished, well-developed in no acute distress.  Head: Normocephalic, atraumatic.   Eyes: Conjunctiva pink, no icterus. Mouth: Oropharyngeal mucosa moist and pink , no lesions erythema or exudate. Neck: Supple without thyromegaly, masses, or lymphadenopathy.  Lungs: Clear to auscultation bilaterally.  Heart: Regular rate and rhythm, no murmurs rubs or gallops.  Abdomen: Bowel sounds are normal, nontender, nondistended, no hepatosplenomegaly or masses, no abdominal bruits or    hernia , no rebound or guarding.   Rectal: Not performed Extremities: No lower extremity edema. No clubbing or deformities.  Neuro: Alert and oriented x 4 , grossly normal neurologically.  Skin: Warm and dry, no rash or jaundice.   Psych: Alert and cooperative, normal mood and affect.  Labs: Lab Results  Component Value Date   CREATININE 0.96 09/01/2017   BUN 23 09/01/2017   NA 140 09/01/2017   K 3.8 09/01/2017   CL 102 09/01/2017   CO2 25 09/01/2017   Lab Results  Component Value Date   WBC 5.2 09/01/2017   HGB 14.2 09/01/2017   HCT 42.0 09/01/2017   MCV 83.7 09/01/2017   PLT 280 09/01/2017   Lab Results  Component Value Date   ALT 15 09/01/2017   AST 20 09/01/2017   ALKPHOS 75 09/20/2016   BILITOT 1.2 09/01/2017     Imaging Studies: No results found.

## 2017-12-22 NOTE — Patient Instructions (Signed)
1. Colonoscopy as scheduled. See separate instructions.  

## 2018-01-04 ENCOUNTER — Other Ambulatory Visit: Payer: Self-pay | Admitting: Family Medicine

## 2018-01-22 ENCOUNTER — Encounter: Payer: Self-pay | Admitting: General Practice

## 2018-03-02 ENCOUNTER — Telehealth: Payer: Self-pay | Admitting: Family Medicine

## 2018-03-02 NOTE — Telephone Encounter (Signed)
Patient called in today with c/o hives with neck swelling and "weird feeling to eyes". Patient informed me that hives have been going away and coming back for the last week. She has tried Benadryl with no relief and she now has a very dryness feeling to throat. Advised patient that with worsening symptoms we recommend that she go to ER or Urgent Care for immediate work up. Patient verbalized understanding.

## 2018-03-06 ENCOUNTER — Encounter: Payer: Self-pay | Admitting: Family Medicine

## 2018-03-06 ENCOUNTER — Other Ambulatory Visit: Payer: Self-pay

## 2018-03-06 ENCOUNTER — Ambulatory Visit (INDEPENDENT_AMBULATORY_CARE_PROVIDER_SITE_OTHER): Payer: BC Managed Care – PPO | Admitting: Family Medicine

## 2018-03-06 VITALS — BP 118/64 | HR 70 | Temp 98.6°F | Resp 12 | Ht 62.5 in | Wt 255.0 lb

## 2018-03-06 DIAGNOSIS — Z1239 Encounter for other screening for malignant neoplasm of breast: Secondary | ICD-10-CM

## 2018-03-06 DIAGNOSIS — Z124 Encounter for screening for malignant neoplasm of cervix: Secondary | ICD-10-CM

## 2018-03-06 DIAGNOSIS — F5101 Primary insomnia: Secondary | ICD-10-CM

## 2018-03-06 DIAGNOSIS — M159 Polyosteoarthritis, unspecified: Secondary | ICD-10-CM

## 2018-03-06 DIAGNOSIS — Z0000 Encounter for general adult medical examination without abnormal findings: Secondary | ICD-10-CM

## 2018-03-06 DIAGNOSIS — R609 Edema, unspecified: Secondary | ICD-10-CM

## 2018-03-06 DIAGNOSIS — I10 Essential (primary) hypertension: Secondary | ICD-10-CM

## 2018-03-06 DIAGNOSIS — Z23 Encounter for immunization: Secondary | ICD-10-CM

## 2018-03-06 DIAGNOSIS — F419 Anxiety disorder, unspecified: Secondary | ICD-10-CM

## 2018-03-06 DIAGNOSIS — M150 Primary generalized (osteo)arthritis: Secondary | ICD-10-CM

## 2018-03-06 DIAGNOSIS — R87610 Atypical squamous cells of undetermined significance on cytologic smear of cervix (ASC-US): Secondary | ICD-10-CM

## 2018-03-06 DIAGNOSIS — M8949 Other hypertrophic osteoarthropathy, multiple sites: Secondary | ICD-10-CM

## 2018-03-06 MED ORDER — TRAZODONE HCL 50 MG PO TABS: 25.0000 mg | ORAL_TABLET | Freq: Every evening | ORAL | 3 refills | Status: DC | PRN

## 2018-03-06 MED ORDER — IBUPROFEN 800 MG PO TABS: 800.0000 mg | ORAL_TABLET | Freq: Three times a day (TID) | ORAL | 0 refills | Status: DC | PRN

## 2018-03-06 MED ORDER — TORSEMIDE 20 MG PO TABS: 20.0000 mg | ORAL_TABLET | Freq: Two times a day (BID) | ORAL | 2 refills | Status: DC | PRN

## 2018-03-06 NOTE — Assessment & Plan Note (Signed)
Due to excess calories Check A1C Reiterated diet and planning meals No exercise

## 2018-03-06 NOTE — Assessment & Plan Note (Signed)
Increase toresmide as tolerated twice a day She took diuretic on her weights at the visit.  At the end of the visit she has to urinate severely was unable to move before talking gushing out.  Advised her to at least wear her compression hose will also help with her fluid retention and to take her diuretic when she is home.

## 2018-03-06 NOTE — Assessment & Plan Note (Signed)
Increased stressors and chronic difficulty sleeping- trial of trazodone She often she does not want to take medications and is not consistent with meds

## 2018-03-06 NOTE — Assessment & Plan Note (Signed)
Per above we will start trazodone

## 2018-03-06 NOTE — Assessment & Plan Note (Signed)
Ibuprofen given

## 2018-03-06 NOTE — Assessment & Plan Note (Signed)
Controlled no changes 

## 2018-03-06 NOTE — Patient Instructions (Signed)
Increase demadex to twice a day for 3 days Get your mammogram Flu shot given Try the trazadone F/U 1 month

## 2018-03-06 NOTE — Progress Notes (Signed)
Subjective:    Patient ID: Andrea Eaton, female    DOB: [birth year], 63 y.o.   MRN: [id number]  Patient presents for Gynecologic Exam (is fasting)   Pt here for CPE , she continues to feel fatigued. She had one of her attacks , she was  [employer]  The day before thanksgiving had numbness on her face and then chest tightness it lasted less than 10 minutes. Has not had another spell unti she was driving here, felt a litle numbness on her face. She does admit to being under a lot of stress and not sleeping well.  She often sleeps in a recliner because she cannot sleep in her bed her mind tends to wander.  She also is worried about oversleeping in order to get her grandson to school.  She did have a difficult holiday remembering the death of her daughter.  She also does a lot for her husband who is a bilateral amputee as well as their church.  He feels she never has time to rest or sit down. Mitts that she does snacks all day and continues to gain weight.  She also had an episode before Christmas when she started itching all over, and her legs broke out she started using diaetic lotion, it cleared up. Then over the past couple of weeks- had small spot on left arm, face and right shoulder and up to neck, now everything dried up. She took benadyl. No change in her routine. She did notice after eating peppermint patty the rash worsened on her neck this past Sunday Monday has now dried up.  Colonoscopy- scheduled for 1/22-positive Hemoccult Due for mammogram  Due for fasting labs    Medications and history reviewed    Peripheral edema, taking demadex, states when she was sick during christamas was in bed and had no swelling    - not wearing her compression hose    Immunizations- Due for flu shot    PAP SMear ASCUS in 2018 - due for repeat PAP Today   END OF Visit asked for ibuprofen for her chronic knee pain   Review Of Systems:  GEN- + fatigue, fever, weight loss,weakness, recent  illness HEENT- denies eye drainage, change in vision, nasal discharge, CVS- denies chest pain, palpitations RESP- denies SOB, cough, wheeze ABD- denies N/V, change in stools, abd pain GU- denies dysuria, hematuria, dribbling, incontinence MSK- denies joint pain, muscle aches, injury Neuro- denies headache, dizziness, syncope, seizure activity       Objective:    BP 118/64   Pulse 70   Temp 98.6 F (37 C) (Oral)   Resp 12   Ht 5' 2.5" (1.588 m)   Wt 255 lb (115.7 kg)   SpO2 99%   BMI 45.90 kg/m  GEN- NAD, alert and oriented x3,obese  HEENT- PERRL, EOMI, non injected sclera, pink conjunctiva, MMM, oropharynx clear Neck- Supple, no thyromegaly Breast- normal symmetry, no nipple inversion,no nipple drainage, no nodules or lumps felt Nodes- no axillary nodes CVS- RRR, no murmur RESP-CTAB ABD-NABS,soft,NT,ND GU- normal external genitalia, vaginal mucosa pink and moist, cervix visualized no growth, no blood form os, minimal thin clear discharge, no CMT, no ovarian masses, uterus normal size Rectum- deferred Psych- normal affect and mood  Neuro- CNII-XII in tact no deficits  Skin- in tact, dry patches EXT- 1+  Edema bilat  Mid shin  Pulses- Radial, DP- 1+        Assessment & Plan:      Problem List  Items Addressed This Visit      Unprioritized   Essential hypertension    Controlled no changes      Relevant Medications   torsemide (DEMADEX) 20 MG tablet   Insomnia    Increased stressors and chronic difficulty sleeping- trial of trazodone She often she does not want to take medications and is not consistent with meds      Mild anxiety    Per above we will start trazodone      Relevant Medications   traZODone (DESYREL) 50 MG tablet   Morbid obesity ([facility])    Due to excess calories Check A1C Reiterated diet and planning meals No exercise      Relevant Orders   Hemoglobin A1c   Lipid panel   TSH   OA (osteoarthritis)    Ibuprofen given      Relevant  Medications   ibuprofen (ADVIL,MOTRIN) 800 MG tablet   Peripheral edema    Increase toresmide as tolerated twice a day She took diuretic on her weights at the visit.  At the end of the visit she has to urinate severely was unable to move before talking gushing out.  Advised her to at least wear her compression hose will also help with her fluid retention and to take her diuretic when she is home.      Relevant Orders   Comprehensive metabolic panel   Routine general medical examination at a health care facility - Primary    Pt to schedule mammogram PAP Done Colonoscopy scheduled Flu shot given      Relevant Orders   CBC with Differential/Platelet   Comprehensive metabolic panel   Hemoglobin A1c   Lipid panel   TSH    Other Visit Diagnoses    Need for immunization against influenza       Relevant Orders   Flu Vaccine QUAD 36+ mos IM (Completed)   Breast cancer screening       Relevant Orders   MM 3D SCREEN BREAST BILATERAL   Cervical cancer screening       Relevant Orders   Pap IG w/ reflex to HPV when ASC-U   Atypical squamous cells of undetermined significance on cytologic smear of cervix (ASC-US)       Relevant Orders   Pap IG w/ reflex to HPV when ASC-U      Note: This dictation was prepared with Dragon dictation along with smaller phrase technology. Any transcriptional errors that result from this process are unintentional.

## 2018-03-06 NOTE — Assessment & Plan Note (Signed)
Pt to schedule mammogram PAP Done Colonoscopy scheduled Flu shot given

## 2018-03-07 LAB — HEMOGLOBIN A1C
Hgb A1c MFr Bld: 5.7 % of total Hgb — ABNORMAL HIGH (ref <5.7)
Mean Plasma Glucose: 117 (calc)
eAG (mmol/L): 6.5 (calc)

## 2018-03-07 LAB — COMPREHENSIVE METABOLIC PANEL
AG RATIO: 1.1 (calc) (ref 1.0–2.5)
ALT: 14 U/L (ref 6–29)
AST: 20 U/L (ref 10–35)
Albumin: 4.2 g/dL (ref 3.6–5.1)
Alkaline phosphatase (APISO): 101 U/L (ref 33–130)
BUN: 17 mg/dL (ref 7–25)
CO2: 28 mmol/L (ref 20–32)
Calcium: 10.1 mg/dL (ref 8.6–10.4)
Chloride: 99 mmol/L (ref 98–110)
Creat: 0.88 mg/dL (ref 0.50–0.99)
GLUCOSE: 85 mg/dL (ref 65–99)
Globulin: 3.8 g/dL (calc) — ABNORMAL HIGH (ref 1.9–3.7)
Potassium: 4 mmol/L (ref 3.5–5.3)
Sodium: 142 mmol/L (ref 135–146)
TOTAL PROTEIN: 8 g/dL (ref 6.1–8.1)
Total Bilirubin: 0.8 mg/dL (ref 0.2–1.2)

## 2018-03-07 LAB — CBC WITH DIFFERENTIAL/PLATELET
Absolute Monocytes: 269 cells/uL (ref 200–950)
BASOS ABS: 22 {cells}/uL (ref 0–200)
Basophils Relative: 0.4 %
EOS ABS: 78 {cells}/uL (ref 15–500)
EOS PCT: 1.4 %
HCT: 44.3 % (ref 35.0–45.0)
HEMOGLOBIN: 14.9 g/dL (ref 11.7–15.5)
Lymphs Abs: 2632 cells/uL (ref 850–3900)
MCH: 29 pg (ref 27.0–33.0)
MCHC: 33.6 g/dL (ref 32.0–36.0)
MCV: 86.4 fL (ref 80.0–100.0)
MONOS PCT: 4.8 %
MPV: 10.2 fL (ref 7.5–12.5)
NEUTROS PCT: 46.4 %
Neutro Abs: 2598 cells/uL (ref 1500–7800)
Platelets: 321 10*3/uL (ref 140–400)
RBC: 5.13 10*6/uL — ABNORMAL HIGH (ref 3.80–5.10)
RDW: 13.5 % (ref 11.0–15.0)
TOTAL LYMPHOCYTE: 47 %
WBC: 5.6 10*3/uL (ref 3.8–10.8)

## 2018-03-07 LAB — LIPID PANEL
Cholesterol: 177 mg/dL (ref <200)
HDL: 76 mg/dL (ref >50)
LDL Cholesterol (Calc): 85 mg/dL (calc)
Non-HDL Cholesterol (Calc): 101 mg/dL (calc) (ref <130)
Total CHOL/HDL Ratio: 2.3 (calc) (ref <5.0)
Triglycerides: 69 mg/dL (ref <150)

## 2018-03-07 LAB — TSH: TSH: 4.05 mIU/L (ref 0.40–4.50)

## 2018-03-11 ENCOUNTER — Other Ambulatory Visit: Payer: Self-pay

## 2018-03-11 ENCOUNTER — Ambulatory Visit (HOSPITAL_COMMUNITY)
Admission: RE | Admit: 2018-03-11 | Discharge: 2018-03-11 | Disposition: A | Discharge status: Home / Self Care | Payer: BC Managed Care – PPO | Attending: Gastroenterology | Admitting: Gastroenterology

## 2018-03-11 ENCOUNTER — Encounter (HOSPITAL_COMMUNITY)
Admission: RE | Disposition: A | Discharge status: Home / Self Care | Payer: Self-pay | Source: Home / Self Care | Attending: Gastroenterology

## 2018-03-11 ENCOUNTER — Encounter (HOSPITAL_COMMUNITY): Payer: Self-pay | Admitting: *Deleted

## 2018-03-11 ENCOUNTER — Telehealth: Payer: Self-pay | Admitting: Gastroenterology

## 2018-03-11 DIAGNOSIS — D124 Benign neoplasm of descending colon: Secondary | ICD-10-CM

## 2018-03-11 DIAGNOSIS — K648 Other hemorrhoids: Secondary | ICD-10-CM | POA: Insufficient documentation

## 2018-03-11 DIAGNOSIS — R195 Other fecal abnormalities: Secondary | ICD-10-CM | POA: Diagnosis present

## 2018-03-11 DIAGNOSIS — Q438 Other specified congenital malformations of intestine: Secondary | ICD-10-CM | POA: Diagnosis not present

## 2018-03-11 DIAGNOSIS — I10 Essential (primary) hypertension: Secondary | ICD-10-CM | POA: Diagnosis not present

## 2018-03-11 DIAGNOSIS — K635 Polyp of colon: Secondary | ICD-10-CM | POA: Diagnosis not present

## 2018-03-11 DIAGNOSIS — Z1211 Encounter for screening for malignant neoplasm of colon: Secondary | ICD-10-CM | POA: Diagnosis not present

## 2018-03-11 HISTORY — PX: COLONOSCOPY: SHX5424

## 2018-03-11 HISTORY — PX: POLYPECTOMY: SHX5525

## 2018-03-11 LAB — PAP IG W/ RFLX HPV ASCU

## 2018-03-11 SURGERY — COLONOSCOPY
Anesthesia: Moderate Sedation

## 2018-03-11 MED ORDER — MEPERIDINE HCL 100 MG/ML IJ SOLN
INTRAMUSCULAR | Status: AC
  Filled 2018-03-11: qty 2

## 2018-03-11 MED ORDER — SODIUM CHLORIDE 0.9 % IV SOLN
INTRAVENOUS | Status: DC
  Administered 2018-03-11: 10:00:00 via INTRAVENOUS

## 2018-03-11 MED ORDER — STERILE WATER FOR IRRIGATION IR SOLN
Status: DC | PRN
  Administered 2018-03-11: 1.5 mL

## 2018-03-11 MED ORDER — MIDAZOLAM HCL 5 MG/5ML IJ SOLN
INTRAMUSCULAR | Status: DC | PRN
  Administered 2018-03-11: 1 mg via INTRAVENOUS
  Administered 2018-03-11 (×2): 2 mg via INTRAVENOUS

## 2018-03-11 MED ORDER — MEPERIDINE HCL 100 MG/ML IJ SOLN
INTRAMUSCULAR | Status: DC | PRN
  Administered 2018-03-11: 50 mg via INTRAVENOUS
  Administered 2018-03-11: 25 mg via INTRAVENOUS

## 2018-03-11 MED ORDER — MIDAZOLAM HCL 5 MG/5ML IJ SOLN
INTRAMUSCULAR | Status: AC
  Filled 2018-03-11: qty 10

## 2018-03-11 NOTE — H&P (Signed)
Primary Care Physician:  Alycia Rossetti, MD Primary Gastroenterologist:  Dr. Oneida Alar  Pre-Procedure History & Physical: HPI:  Andrea Eaton is a 63 y.o. female here for HEME POS STOOLS.  Past Medical History:  Diagnosis Date  . Arthritis    Dr. Sharol Given  . Hypertension     Past Surgical History:  Procedure Laterality Date  . COLONOSCOPY  07/2007   Dr. Fuller Plan: diverticulosis, internal hemorrhoids  . ESOPHAGOGASTRODUODENOSCOPY  07/2007   Dr. Fuller Plan: H.pylori gastritis    Prior to Admission medications   Medication Sig Start Date End Date Taking? Authorizing Provider  Multiple Vitamins-Minerals (CENTRUM SILVER ULTRA WOMENS) TABS Take 1 tablet by mouth daily.   Yes [provider]  Potassium 99 MG TABS Take 99 mg by mouth daily.   Yes [provider]  ibuprofen (ADVIL,MOTRIN) 800 MG tablet Take 1 tablet (800 mg total) by mouth every 8 (eight) hours as needed. 03/06/18   Alycia Rossetti, MD  torsemide (DEMADEX) 20 MG tablet Take 1 tablet (20 mg total) by mouth 2 (two) times daily as needed. 03/06/18   Alycia Rossetti, MD  traZODone (DESYREL) 50 MG tablet Take 0.5-1 tablets (25-50 mg total) by mouth at bedtime as needed for sleep. 03/06/18   Alycia Rossetti, MD    Allergies as of 12/22/2017  . (No Known Allergies)    Family History  Problem Relation Age of Onset  . Hypertension Mother   . Diabetes Mother   . Hypertension Father   . Heart disease Father   . Diabetes Sister   . Hypertension Sister   . Hyperlipidemia Sister   . Heart disease Sister   . Hypertension Brother   . Diabetes Brother   . Colon cancer Neg Hx     Social History   Socioeconomic History  . Marital status: Married    Spouse name: Not on file  . Number of children: Not on file  . Years of education: Not on file  . Highest education level: Not on file  Occupational History  . Not on file  Social Needs  . Financial resource strain: Not hard at all  . Food insecurity:    Worry:  Never true    Inability: Never true  . Transportation needs:    Medical: No    Non-medical: No  Tobacco Use  . Smoking status: Never Smoker  . Smokeless tobacco: Never Used  Substance and Sexual Activity  . Alcohol use: No  . Drug use: No  . Sexual activity: Never  Lifestyle  . Physical activity:    Days per week: 0 days    Minutes per session: 0 min  . Stress: Not at all  Relationships  . Social connections:    Talks on phone: More than three times a week    Gets together: Twice a week    Attends religious service: More than 4 times per year    Active member of club or organization: No    Attends meetings of clubs or organizations: Never    Relationship status: Married  . Intimate partner violence:    Fear of current or ex partner: No    Emotionally abused: No    Physically abused: No    Forced sexual activity: No  Other Topics Concern  . Not on file  Social History Narrative  . Not on file    Review of Systems: See HPI, otherwise negative ROS   Physical Exam: BP 137/64   Pulse 67  Temp 98.1 F (36.7 C) (Oral)   Resp 17   Ht 5' 2.5" (1.588 m)   Wt 115.7 kg   SpO2 100%   BMI 45.90 kg/m  General:   Alert,  pleasant and cooperative in NAD Head:  Normocephalic and atraumatic. Neck:  Supple; Lungs:  Clear throughout to auscultation.    Heart:  Regular rate and rhythm. Abdomen:  Soft, nontender and nondistended. Normal bowel sounds, without guarding, and without rebound.   Neurologic:  Alert and  oriented x4;  grossly normal neurologically.  Impression/Plan:       PLAN:  1.TCS TODAY. DISCUSSED PROCEDURE, BENEFITS, & RISKS: < 1% chance of medication reaction, bleeding, perforation, or rupture of spleen/liver.

## 2018-03-11 NOTE — Op Note (Signed)
[facility] Patient Name: Andrea Eaton Procedure Date: 03/11/2018 10:33 AM MRN: [id number] Date of Birth: 04/03/1955 Attending MD: Barney Drain MD, MD CSN: [id number] Age: 63 Admit Type: Outpatient Procedure:                Colonoscopy WITH COLD SNARE POLYPECTOMY Indications:              Screening for colorectal malignant neoplasm Providers:                Barney Drain MD, MD, Hinton Rao, RN, Lurline Del,                            RN Referring MD:             Modena Nunnery. [place] Medicines:                Meperidine 100 mg IV, Midazolam 5 mg IV Complications:            No immediate complications. Estimated Blood Loss:     Estimated blood loss was minimal. Procedure:                Pre-Anesthesia Assessment:                           - Prior to the procedure, a History and Physical                            was performed, and patient medications and                            allergies were reviewed. The patient's tolerance of                            previous anesthesia was also reviewed. The risks                            and benefits of the procedure and the sedation                            options and risks were discussed with the patient.                            All questions were answered, and informed consent                            was obtained. Prior Anticoagulants: The patient has                            taken no previous anticoagulant or antiplatelet                            agents. ASA Grade Assessment: II - A patient with                            mild systemic disease. After reviewing the risks  and benefits, the patient was deemed in                            satisfactory condition to undergo the procedure.                            After obtaining informed consent, the colonoscope                            was passed under direct vision. Throughout the                            procedure, the patient's blood  pressure, pulse, and                            oxygen saturations were monitored continuously. The                            [id number]-[id number] ([id number]) was introduced through the                            anus and advanced to the the cecum, identified by                            appendiceal orifice and ileocecal valve. The                            colonoscopy was somewhat difficult due to a                            tortuous colon. Successful completion of the                            procedure was aided by straightening and shortening                            the scope to obtain bowel loop reduction and                            COLOWRAP. The patient tolerated the procedure well.                            The quality of the bowel preparation was good. The                            ileocecal valve, appendiceal orifice, and rectum                            were photographed. Scope In: 10:59:00 AM Scope Out: 11:18:25 AM Scope Withdrawal Time: 0 hours 14 minutes 51 seconds  Total Procedure Duration: 0 hours 19 minutes 25 seconds  Findings:      A 4 mm polyp was found in the descending colon. The polyp was sessile.       The  polyp was removed with a cold snare. Resection and retrieval were       complete.      The recto-sigmoid colon, sigmoid colon and descending colon were       moderately redundant.      Internal hemorrhoids were found. The hemorrhoids were moderate. Impression:               - One 4 mm polyp in the descending colon, removed                            with a cold snare. Resected and retrieved.                           - Redundant LEFT colon.                           - HEME POSITIVE STOOLS DUE TO Internal hemorrhoids. Moderate Sedation:      Moderate (conscious) sedation was administered by the endoscopy nurse       and supervised by the endoscopist. The following parameters were       monitored: oxygen saturation, heart rate, blood pressure, and response        to care. Total physician intraservice time was 31 minutes. Recommendation:           - Patient has a contact number available for                            emergencies. The signs and symptoms of potential                            delayed complications were discussed with the                            patient. Return to normal activities tomorrow.                            Written discharge instructions were provided to the                            patient.                           - High fiber diet.                           - Continue present medications.                           - Await pathology results.                           - Repeat colonoscopy in 5-10 years for                            surveillance. PT HAS HAD AGE APPROPRIATE COLON  CANCER SCREENING. NO NEED TO HEME TEST STOOLS IN                            THE INTERVAL BEWTEEN COLONOSCOPIES. Procedure Code(s):        --- Professional ---                           [phone], Colonoscopy, flexible; with removal of                            tumor(s), polyp(s), or other lesion(s) by snare                            technique                           99153, Moderate sedation; each additional 15                            minutes intraservice time                           G0500, Moderate sedation services provided by the                            same physician or other qualified health care                            professional performing a gastrointestinal                            endoscopic service that sedation supports,                            requiring the presence of an independent trained                            observer to assist in the monitoring of the                            patient's level of consciousness and physiological                            status; initial 15 minutes of intra-service time;                            patient age 12 years or older (additional  time may                            be reported with [phone], as appropriate) Diagnosis Code(s):        --- Professional ---                           Z12.11, Encounter for screening for malignant  neoplasm of colon                           D12.4, Benign neoplasm of descending colon                           K64.8, Other hemorrhoids                           Q43.8, Other specified congenital malformations of                            intestine CPT copyright 2018 [employer]. All rights reserved. The codes documented in this report are preliminary and upon coder review may  be revised to meet current compliance requirements. Barney Drain, MD Barney Drain MD, MD 03/11/2018 11:29:58 AM This report has been signed electronically. Number of Addenda: 0

## 2018-03-11 NOTE — Discharge Instructions (Signed)
You have internal hemorrhoids and diverticulosis IN YOUR LEFT AND RIGHT COLON. YOU HAD ONE SMALL POLYP REMOVED.    DRINK WATER TO KEEP YOUR URINE LIGHT YELLOW.  CONTINUE YOUR WEIGHT LOSS EFFORTS. YOUR BODY MASS INDEX IS OVER 40 WHICH MEANS YOU ARE MORBIDLY OBESE. OBESITY IS ASSOCIATED WITH AN INCREASED FOR CIRRHOSIS AND ALL CANCERS, INCLUDING ESOPHAGEAL AND COLON CANCER. A WEIGHT OF 220 LBS OR LESS WILL GET YOUR BODY MASS INDEX(BMI) UNDER 40 BUT YOUR BODY MASS INDEX WILL STILL BE OVER 30 WHICH MEANS YOU ARE OBESE.  YOU SHOULD THEN  TRY FOR A WEIGHT OF 165 LBS OR LESS  TO GET YOUR BODY MASS INDEX(BMI) UNDER 30. FOLLOW A HIGH FIBER DIET. AVOID ITEMS THAT CAUSE BLOATING. See info below.  YOUR BIOPSY RESULTS WILL BE BACK IN 5 BUSINESS DAYS.  USE PREPARATION H FOUR TIMES  A DAY IF NEEDED TO RELIEVE RECTAL PAIN/PRESSURE/BLEEDING.  Next colonoscopy in 5-10 years.  Colonoscopy Care After Read the instructions outlined below and refer to this sheet in the next week. These discharge instructions provide you with general information on caring for yourself after you leave the hospital. While your treatment has been planned according to the most current medical practices available, unavoidable complications occasionally occur. If you have any problems or questions after discharge, call DR. Antonie Borjon, [phone].  ACTIVITY  You may resume your regular activity, but move at a slower pace for the next 24 hours.   Take frequent rest periods for the next 24 hours.   Walking will help get rid of the air and reduce the bloated feeling in your belly (abdomen).   No driving for 24 hours (because of the medicine (anesthesia) used during the test).   You may shower.   Do not sign any important legal documents or operate any machinery for 24 hours (because of the anesthesia used during the test).    NUTRITION  Drink plenty of fluids.   You may resume your normal diet as instructed by your doctor.    Begin with a light meal and progress to your normal diet. Heavy or fried foods are harder to digest and may make you feel sick to your stomach (nauseated).   Avoid alcoholic beverages for 24 hours or as instructed.    MEDICATIONS  You may resume your normal medications.   WHAT YOU CAN EXPECT TODAY  Some feelings of bloating in the abdomen.   Passage of more gas than usual.   Spotting of blood in your stool or on the toilet paper  .  IF YOU HAD POLYPS REMOVED DURING THE COLONOSCOPY:  Eat a soft diet IF YOU HAVE NAUSEA, BLOATING, ABDOMINAL PAIN, OR VOMITING.    FINDING OUT THE RESULTS OF YOUR TEST Not all test results are available during your visit. DR. Oneida Alar WILL CALL YOU WITHIN 14 DAYS OF YOUR PROCEDUE WITH YOUR RESULTS. Do not assume everything is normal if you have not heard from DR. Marylin Lathon, CALL HER OFFICE AT [phone].  SEEK IMMEDIATE MEDICAL ATTENTION AND CALL THE OFFICE: [phone] IF:  You have more than a spotting of blood in your stool.   Your belly is swollen (abdominal distention).   You are nauseated or vomiting.   You have a temperature over 101F.   You have abdominal pain or discomfort that is severe or gets worse throughout the day.  High-Fiber Diet A high-fiber diet changes your normal diet to include more whole grains, legumes, fruits, and vegetables. Changes in the diet involve replacing  refined carbohydrates with unrefined foods. The calorie level of the diet is essentially unchanged. The Dietary Reference Intake (recommended amount) for adult males is 38 grams per day. For adult females, it is 25 grams per day. Pregnant and lactating women should consume 28 grams of fiber per day. Fiber is the intact part of a plant that is not broken down during digestion. Functional fiber is fiber that has been isolated from the plant to provide a beneficial effect in the body.  PURPOSE  Increase stool bulk.   Ease and regulate bowel movements.   Lower  cholesterol.   REDUCE RISK OF COLON CANCER  INDICATIONS THAT YOU NEED MORE FIBER  Constipation and hemorrhoids.   Uncomplicated diverticulosis (intestine condition) and irritable bowel syndrome.   Weight management.   As a protective measure against hardening of the arteries (atherosclerosis), diabetes, and cancer.   GUIDELINES FOR INCREASING FIBER IN THE DIET  Start adding fiber to the diet slowly. A gradual increase of about 5 more grams (2 slices of whole-wheat bread, 2 servings of most fruits or vegetables, or 1 bowl of high-fiber cereal) per day is best. Too rapid an increase in fiber may result in constipation, flatulence, and bloating.   Drink enough water and fluids to keep your urine clear or pale yellow. Water, juice, or caffeine-free drinks are recommended. Not drinking enough fluid may cause constipation.   Eat a variety of high-fiber foods rather than one type of fiber.   Try to increase your intake of fiber through using high-fiber foods rather than fiber pills or supplements that contain small amounts of fiber.   The goal is to change the types of food eaten. Do not supplement your present diet with high-fiber foods, but replace foods in your present diet.   INCLUDE A VARIETY OF FIBER SOURCES  Replace refined and processed grains with whole grains, canned fruits with fresh fruits, and incorporate other fiber sources. White rice, white breads, and most bakery goods contain little or no fiber.   Brown whole-grain rice, buckwheat oats, and many fruits and vegetables are all good sources of fiber. These include: broccoli, Brussels sprouts, cabbage, cauliflower, beets, sweet potatoes, white potatoes (skin on), carrots, tomatoes, eggplant, squash, berries, fresh fruits, and dried fruits.   Cereals appear to be the richest source of fiber. Cereal fiber is found in whole grains and bran. Bran is the fiber-rich outer coat of cereal grain, which is largely removed in refining. In  whole-grain cereals, the bran remains. In breakfast cereals, the largest amount of fiber is found in those with "bran" in their names. The fiber content is sometimes indicated on the label.   You may need to include additional fruits and vegetables each day.   In baking, for 1 cup white flour, you may use the following substitutions:   1 cup whole-wheat flour minus 2 tablespoons.   1/2 cup white flour plus 1/2 cup whole-wheat flour.   Polyps, Colon  A polyp is extra tissue that grows inside your body. Colon polyps grow in the large intestine. The large intestine, also called the colon, is part of your digestive system. It is a long, hollow tube at the end of your digestive tract where your body makes and stores stool. Most polyps are not dangerous. They are benign. This means they are not cancerous. But over time, some types of polyps can turn into cancer. Polyps that are smaller than a pea are usually not harmful. But larger polyps could someday become or  may already be cancerous. To be safe, doctors remove all polyps and test them.   PREVENTION There is not one sure way to prevent polyps. You might be able to lower your risk of getting them if you:  Eat more fruits and vegetables and less fatty food.   Do not smoke.   Avoid alcohol.   Exercise every day.   Lose weight if you are overweight.   Eating more calcium and folate can also lower your risk of getting polyps. Some foods that are rich in calcium are milk, cheese, and broccoli. Some foods that are rich in folate are chickpeas, kidney beans, and spinach.    Diverticulosis Diverticulosis is a common condition that develops when small pouches (diverticula) form in the wall of the colon. The risk of diverticulosis increases with age. It happens more often in people who eat a low-fiber diet. Most individuals with diverticulosis have no symptoms. Those individuals with symptoms usually experience belly (abdominal) pain, constipation,  or loose stools (diarrhea).  HOME CARE INSTRUCTIONS  Increase the amount of fiber in your diet as directed by your caregiver or dietician. This may reduce symptoms of diverticulosis.   Drink at least 6 to 8 glasses of water each day to prevent constipation.   Try not to strain when you have a bowel movement.   Avoiding nuts and seeds to prevent complications is NOT NECESSARY.   FOODS HAVING HIGH FIBER CONTENT INCLUDE:  Fruits. Apple, peach, pear, tangerine, raisins, prunes.   Vegetables. Brussels sprouts, asparagus, broccoli, cabbage, carrot, cauliflower, romaine lettuce, spinach, summer squash, tomato, winter squash, zucchini.   Starchy Vegetables. Baked beans, kidney beans, lima beans, split peas, lentils, potatoes (with skin).   Grains. Whole wheat bread, brown rice, bran flake cereal, plain oatmeal, white rice, shredded wheat, bran muffins.   SEEK IMMEDIATE MEDICAL CARE IF:  You develop increasing pain or severe bloating.   You have an oral temperature above 101F.   You develop vomiting or bowel movements that are bloody or black.

## 2018-03-11 NOTE — Telephone Encounter (Signed)
PT HAD H PYLORI GASTRITIS IN 2009. NO DOCUMENTED RESULTS FOR ERADICATION. NEEDS H PYLORI BREATH TEST.

## 2018-03-12 ENCOUNTER — Other Ambulatory Visit: Payer: Self-pay

## 2018-03-12 ENCOUNTER — Encounter: Payer: Self-pay | Admitting: *Deleted

## 2018-03-12 DIAGNOSIS — A048 Other specified bacterial intestinal infections: Secondary | ICD-10-CM

## 2018-03-12 NOTE — Telephone Encounter (Signed)
Pt is aware and will go to [facility] for the lab. She is not on a PPI. She is aware to not eat or drink anything for one hour prior to going to the lab.

## 2018-03-12 NOTE — Telephone Encounter (Signed)
Orders have been entered and released.

## 2018-03-13 ENCOUNTER — Telehealth: Payer: Self-pay | Admitting: Gastroenterology

## 2018-03-13 ENCOUNTER — Encounter (HOSPITAL_COMMUNITY): Payer: Self-pay | Admitting: Gastroenterology

## 2018-03-13 NOTE — Telephone Encounter (Signed)
Please call pt. She had ONE BENIGN polypoid lesion removed FROM HER COLON and it was benign.    DRINK WATER TO KEEP YOUR URINE LIGHT YELLOW.  CONTINUE YOUR WEIGHT LOSS EFFORTS.  FOLLOW A HIGH FIBER DIET. AVOID ITEMS THAT CAUSE BLOATING.   USE PREPARATION H FOUR TIMES  A DAY IF NEEDED TO RELIEVE RECTAL PAIN/PRESSURE/BLEEDING.  Next colonoscopy in 10 years NOT 5 YEARS.

## 2018-03-16 NOTE — Telephone Encounter (Signed)
Pt is aware.  

## 2018-03-17 NOTE — Telephone Encounter (Signed)
Reminder in epic °

## 2018-03-18 ENCOUNTER — Telehealth: Payer: Self-pay | Admitting: Gastroenterology

## 2018-03-18 LAB — H. PYLORI BREATH TEST: H. pylori Breath Test: NOT DETECTED

## 2018-03-18 NOTE — Telephone Encounter (Signed)
Please call pt. HER H. Pylori BREATH TEST is negative. The H pylori infection has been cured. HER SISTERS, BROTHERS, CHILDREN, AND PARENTS SHOULD SE THEIR PCP TO ORDER THE H PYLORI STOOL BREATH TEST.

## 2018-03-18 NOTE — Telephone Encounter (Signed)
Left a message to return the call

## 2018-03-19 NOTE — Telephone Encounter (Signed)
Pt is aware.  

## 2018-03-28 ENCOUNTER — Other Ambulatory Visit: Payer: Self-pay | Admitting: Family Medicine

## 2018-03-30 ENCOUNTER — Ambulatory Visit (HOSPITAL_COMMUNITY)
Admission: RE | Admit: 2018-03-30 | Discharge: 2018-03-30 | Disposition: A | Discharge status: Home / Self Care | Payer: BC Managed Care – PPO | Source: Ambulatory Visit | Attending: Family Medicine | Admitting: Family Medicine

## 2018-03-30 DIAGNOSIS — Z1239 Encounter for other screening for malignant neoplasm of breast: Secondary | ICD-10-CM | POA: Insufficient documentation

## 2018-04-06 ENCOUNTER — Other Ambulatory Visit: Payer: Self-pay

## 2018-04-06 ENCOUNTER — Encounter: Payer: Self-pay | Admitting: Family Medicine

## 2018-04-06 ENCOUNTER — Ambulatory Visit: Payer: BC Managed Care – PPO | Admitting: Family Medicine

## 2018-04-06 VITALS — BP 116/68 | HR 70 | Temp 98.3°F | Resp 16 | Ht 62.5 in | Wt 255.0 lb

## 2018-04-06 DIAGNOSIS — R609 Edema, unspecified: Secondary | ICD-10-CM | POA: Diagnosis not present

## 2018-04-06 DIAGNOSIS — F419 Anxiety disorder, unspecified: Secondary | ICD-10-CM | POA: Diagnosis not present

## 2018-04-06 MED ORDER — IBUPROFEN 800 MG PO TABS: 800.0000 mg | ORAL_TABLET | Freq: Three times a day (TID) | ORAL | 1 refills | Status: DC | PRN

## 2018-04-06 NOTE — Patient Instructions (Signed)
F/U 4 months  

## 2018-04-06 NOTE — Progress Notes (Addendum)
   Subjective:    Patient ID: Andrea Eaton, female    DOB: 1955-10-16, 63 y.o.   MRN: [id number]  Patient presents for Follow-up (is not fasting) Pt here for intermin f/u   Peripheral edema- Demadex- taking diuretic every day,, has not been wearing her compression hose  As they roll over, but wears compression hosiery.  She can tell a difference in her legs.  She would like a prescription for the compression pantyhose from the pharmacy.  Anxiety with some insomnia.  She did not take the trazodone until last night after the first dose.  She took 25 mg states that she could tell she was more calm but otherwise has not had any side effects.  She only took the medication as she knew she was coming in for follow-up appointment but is willing to take it as she did fine  with it last night.  She has some severe back pain a few weeks ago.  She took ibuprofen and this helped.  He is also constipated and took a laxative.  No further back pain but was unsure what caused it.  She does not have any urinary symptoms  Review Of Systems:  GEN- denies fatigue, fever, weight loss,weakness, recent illness HEENT- denies eye drainage, change in vision, nasal discharge, CVS- denies chest pain, palpitations RESP- denies SOB, cough, wheeze ABD- denies N/V, change in stools, abd pain GU- denies dysuria, hematuria, dribbling, incontinence MSK- denies joint pain, muscle aches, injury Neuro- denies headache, dizziness, syncope, seizure activity       Objective:    BP 116/68   Pulse 70   Temp 98.3 F (36.8 C) (Oral)   Resp 16   Ht 5' 2.5" (1.588 m)   Wt 255 lb (115.7 kg)   SpO2 100%   BMI 45.90 kg/m  GEN- NAD, alert and oriented x3 HEENT- PERRL, EOMI, non injected sclera, pink conjunctiva, MMM, oropharynx clear CVS- RRR, no murmur RESP-CTAB ABD-NABS,soft,NT,ND Psych- normal affect and mood  Neuro- CNII-XII in tact no deficits  MSK- Spine NT, FAIR ROM, Hips fair ROM, strength in tact bilat, neg  SLR Skin- in tact, dry patches EXT- 1+  Edema bilat ankles Pulses- Radial, DP- 1+       Assessment & Plan:  Back  pain has resolved normal physical exam advised as discussed return to make a follow-up appointment so she can be examined when this is going on.   Problem List Items Addressed This Visit      Unprioritized   Mild anxiety    Recommend that she try the trazodone consistently as she did see some improvement last night.  She can go up to the 50 mg      Peripheral edema - Primary    No change in her overall weight by her peripheral edema does look better today.  I did give her a prescription for pantyhose 20 to 30 mmHg         Note: This dictation was prepared with Dragon dictation along with smaller phrase technology. Any transcriptional errors that result from this process are unintentional.

## 2018-04-06 NOTE — Assessment & Plan Note (Signed)
No change in her overall weight by her peripheral edema does look better today.  I did give her a prescription for pantyhose 20 to 30 mmHg

## 2018-04-06 NOTE — Assessment & Plan Note (Signed)
Recommend that she try the trazodone consistently as she did see some improvement last night.  She can go up to the 50 mg

## 2018-06-01 ENCOUNTER — Telehealth: Payer: Self-pay | Admitting: *Deleted

## 2018-06-01 NOTE — Telephone Encounter (Signed)
Put pt in for office visit

## 2018-06-01 NOTE — Telephone Encounter (Signed)
Received call from patient. [phone]~ telephone.   Reports that she feels that she has gout in her great toe.   MD please advise.

## 2018-06-01 NOTE — Telephone Encounter (Signed)
Appointment scheduled.

## 2018-06-02 ENCOUNTER — Ambulatory Visit: Payer: BC Managed Care – PPO | Admitting: Family Medicine

## 2018-06-02 ENCOUNTER — Other Ambulatory Visit: Payer: Self-pay

## 2018-06-02 ENCOUNTER — Encounter: Payer: Self-pay | Admitting: Family Medicine

## 2018-06-02 VITALS — BP 124/72 | HR 64 | Temp 97.9°F | Resp 14 | Ht 62.5 in | Wt 258.0 lb

## 2018-06-02 DIAGNOSIS — I10 Essential (primary) hypertension: Secondary | ICD-10-CM | POA: Diagnosis not present

## 2018-06-02 DIAGNOSIS — R609 Edema, unspecified: Secondary | ICD-10-CM | POA: Diagnosis not present

## 2018-06-02 DIAGNOSIS — M10071 Idiopathic gout, right ankle and foot: Secondary | ICD-10-CM | POA: Diagnosis not present

## 2018-06-02 LAB — COMPLETE METABOLIC PANEL WITH GFR
AG Ratio: 1.1 (calc) (ref 1.0–2.5)
ALT: 15 U/L (ref 6–29)
AST: 19 U/L (ref 10–35)
Albumin: 3.7 g/dL (ref 3.6–5.1)
Alkaline phosphatase (APISO): 81 U/L (ref 37–153)
BUN: 20 mg/dL (ref 7–25)
CO2: 26 mmol/L (ref 20–32)
Calcium: 9.5 mg/dL (ref 8.6–10.4)
Chloride: 103 mmol/L (ref 98–110)
Creat: 0.87 mg/dL (ref 0.50–0.99)
GFR, Est African American: 82 mL/min/{1.73_m2} (ref >=60)
GFR, Est Non African American: 71 mL/min/{1.73_m2} (ref >=60)
Globulin: 3.5 g/dL (calc) (ref 1.9–3.7)
Glucose, Bld: 81 mg/dL (ref 65–99)
Potassium: 4 mmol/L (ref 3.5–5.3)
Sodium: 139 mmol/L (ref 135–146)
Total Bilirubin: 0.8 mg/dL (ref 0.2–1.2)
Total Protein: 7.2 g/dL (ref 6.1–8.1)

## 2018-06-02 LAB — LIPID PANEL
Cholesterol: 165 mg/dL (ref <200)
HDL: 64 mg/dL (ref >=50)
LDL Cholesterol (Calc): 85 mg/dL (calc)
Non-HDL Cholesterol (Calc): 101 mg/dL (calc) (ref <130)
Total CHOL/HDL Ratio: 2.6 (calc) (ref <5.0)
Triglycerides: 71 mg/dL (ref <150)

## 2018-06-02 LAB — CBC
HCT: 40.5 % (ref 35.0–45.0)
Hemoglobin: 13.8 g/dL (ref 11.7–15.5)
MCH: 28.8 pg (ref 27.0–33.0)
MCHC: 34.1 g/dL (ref 32.0–36.0)
MCV: 84.6 fL (ref 80.0–100.0)
MPV: 10.2 fL (ref 7.5–12.5)
Platelets: 274 10*3/uL (ref 140–400)
RBC: 4.79 10*6/uL (ref 3.80–5.10)
RDW: 13.4 % (ref 11.0–15.0)
WBC: 4.8 10*3/uL (ref 3.8–10.8)

## 2018-06-02 MED ORDER — INDOMETHACIN 25 MG PO CAPS: 25.0000 mg | ORAL_CAPSULE | Freq: Three times a day (TID) | ORAL | 2 refills | Status: DC | PRN

## 2018-06-02 NOTE — Patient Instructions (Addendum)
Take the indocin up to three times a day , but for now take at least morning and evening  Epsom salt soaks  We will call with lab results  F/U as previous

## 2018-06-02 NOTE — Addendum Note (Signed)
Addended by: Sheral Flow on: 06/02/2018 10:34 AM   Modules accepted: Orders

## 2018-06-02 NOTE — Assessment & Plan Note (Signed)
Well controlled 

## 2018-06-02 NOTE — Progress Notes (Signed)
   Subjective:    Patient ID: Andrea Eaton, female    DOB: 02-May-1955, 63 y.o.   MRN: [id number]  Patient presents for Toe Pain (x2 weeks- pain in B great toes- R>L)   Patient here with pain in her left great toe started 2 weeks, ago, now in right great toe. Denies pain in entire foot, but does get pain that shoots down from the toe. She tried to put on tennis shoes yesterday and right toe was very painful unable to walk in the tennis shoes.  Chronic swelling in feet on demadex No redness that she has noticed, no injury.  No significant change in diet, but has had some red meat  She took ibuprofen but this did not help very much. She took 2 extra tylenol which helped a little   No chage in chronic knee pain or back pain   Review Of Systems:  GEN- denies fatigue, fever, weight loss,weakness, recent illness HEENT- denies eye drainage, change in vision, nasal discharge, CVS- denies chest pain, palpitations RESP- denies SOB, cough, wheeze ABD- denies N/V, change in stools, abd pain GU- denies dysuria, hematuria, dribbling, incontinence MSK- + joint pain, muscle aches, injury Neuro- denies headache, dizziness, syncope, seizure activity       Objective:    BP 124/72   Pulse 64   Temp 97.9 F (36.6 C) (Oral)   Resp 14   Ht 5' 2.5" (1.588 m)   Wt 258 lb (117 kg)   SpO2 98%   BMI 46.44 kg/m  GEN- NAD, alert and oriented x3 CVS- RRR, no murmur RESP-CTAB MSK-  Knees fair ROM, no effusion EXT- 1+  Edema bilat ankles, mild swelling right mid foot, TTP base of great toe, mild swelling of right toe, no edema left great toe or mid foot, fair ROM ankle , callus bilat feet  Pulses- Radial, DP- 1+        Assessment & Plan:      Problem List Items Addressed This Visit      Unprioritized   Essential hypertension    Well controlled      Peripheral edema    Check renal function, continue demadex       Other Visit Diagnoses    Acute idiopathic gout involving toe of right  foot    -  Primary   Likley gout based on exam and history, start indocin, check renal function, and Uric acid, CBC, epson salt soak, tylenol can also be used   Relevant Medications   indomethacin (INDOCIN) 25 MG capsule   Other Relevant Orders   Uric Acid   CBC   Essential hypertension, benign          Note: This dictation was prepared with Dragon dictation along with smaller phrase technology. Any transcriptional errors that result from this process are unintentional.

## 2018-06-02 NOTE — Assessment & Plan Note (Signed)
Check renal function, continue demadex

## 2018-06-03 ENCOUNTER — Encounter: Payer: Self-pay | Admitting: *Deleted

## 2018-06-03 LAB — URIC ACID: Uric Acid, Serum: 9.6 mg/dL — ABNORMAL HIGH (ref 2.5–7.0)

## 2018-08-05 ENCOUNTER — Ambulatory Visit: Payer: BC Managed Care – PPO | Admitting: Family Medicine

## 2018-08-05 ENCOUNTER — Encounter: Payer: Self-pay | Admitting: Family Medicine

## 2018-08-05 ENCOUNTER — Other Ambulatory Visit: Payer: Self-pay

## 2018-08-05 VITALS — BP 128/70 | HR 64 | Temp 97.9°F | Resp 12 | Ht 62.5 in | Wt 261.0 lb

## 2018-08-05 DIAGNOSIS — R29818 Other symptoms and signs involving the nervous system: Secondary | ICD-10-CM

## 2018-08-05 DIAGNOSIS — R42 Dizziness and giddiness: Secondary | ICD-10-CM

## 2018-08-05 DIAGNOSIS — M1A9XX0 Chronic gout, unspecified, without tophus (tophi): Secondary | ICD-10-CM

## 2018-08-05 DIAGNOSIS — R7303 Prediabetes: Secondary | ICD-10-CM | POA: Insufficient documentation

## 2018-08-05 DIAGNOSIS — R519 Headache, unspecified: Secondary | ICD-10-CM

## 2018-08-05 DIAGNOSIS — M109 Gout, unspecified: Secondary | ICD-10-CM | POA: Insufficient documentation

## 2018-08-05 DIAGNOSIS — I10 Essential (primary) hypertension: Secondary | ICD-10-CM | POA: Diagnosis not present

## 2018-08-05 DIAGNOSIS — R51 Headache: Secondary | ICD-10-CM

## 2018-08-05 MED ORDER — INDOMETHACIN 25 MG PO CAPS: 25.0000 mg | ORAL_CAPSULE | Freq: Three times a day (TID) | ORAL | 2 refills | Status: DC | PRN

## 2018-08-05 MED ORDER — ALLOPURINOL 100 MG PO TABS: 100.0000 mg | ORAL_TABLET | Freq: Every day | ORAL | 6 refills | Status: DC

## 2018-08-05 NOTE — Assessment & Plan Note (Signed)
Start allopurinol once a day  Advised may cause gout to flare, so will have her continue indocin daily for another 2 weeks

## 2018-08-05 NOTE — Progress Notes (Signed)
   Subjective:    Patient ID: Andrea Eaton, female    DOB: May 03, 1955, 63 y.o.   MRN: [id number]  Patient presents for Follow-up (is fasting)  Pt here to f/u chronic medical problems.    Had gout flare again, in her heel started a couple weeks ago, taking indocin which has helped    Has been getting dizzy more often, felt like she was going to pass out this morning, last a few seconds  When she gets in the bed sometimes has a spinning sensation but it doesn't last long  Has been checking her cbg when she feels dizzy typically 80-113  Blood pressure has also been good  Often feels like she may faint , she does get some headaches   No sinusitis   She has been taking duiretic regulary   Review Of Systems:  GEN- denies fatigue, fever, weight loss,weakness, recent illness HEENT- denies eye drainage, change in vision, nasal discharge, CVS- denies chest pain, palpitations RESP- denies SOB, cough, wheeze ABD- denies N/V, change in stools, abd pain GU- denies dysuria, hematuria, dribbling, incontinence MSK- denies joint pain, muscle aches, injury Neuro- +headache,+ dizziness, syncope, seizure activity       Objective:    BP 128/70   Pulse 64   Temp 97.9 F (36.6 C) (Oral)   Resp 12   Ht 5' 2.5" (1.588 m)   Wt 261 lb (118.4 kg)   SpO2 98%   BMI 46.98 kg/m  GEN- NAD, alert and oriented x3 HEENT- PERRL, EOMI, non injected sclera, pink conjunctiva, MMM, oropharynx clear Neck- Supple, no thyromegaly, no bruit  CVS- RRR, no murmur RESP-CTAB NEURO-CNII-XII in tact, no focal deficits, normal speech  EXT- 1+  Edema bilat ankles, mild TTP Right heel, fair ROM ankle , callus bilat feet  Pulses- Radial, DP- 1+         Assessment & Plan:      Problem List Items Addressed This Visit      Unprioritized   Borderline diabetes   Relevant Orders   Hemoglobin A1c   Dizziness - Primary   Essential hypertension    Blood pressure controlled But concerned for increasing dizzy  spells. ? White matter disease, small ischemic - TIA with her HTN and DM, brain tumor also on differential Obtain MRI      Relevant Orders   CBC with Differential/Platelet   Comprehensive metabolic panel   Gout    Start allopurinol once a day  Advised may cause gout to flare, so will have her continue indocin daily for another 2 weeks       Relevant Orders   Uric Acid      Note: This dictation was prepared with Dragon dictation along with smaller phrase technology. Any transcriptional errors that result from this process are unintentional.

## 2018-08-05 NOTE — Assessment & Plan Note (Addendum)
Blood pressure controlled But concerned for increasing dizzy spells. ? White matter disease, small ischemic - TIA with her HTN and DM, brain tumor also on differential Obtain MRI

## 2018-08-05 NOTE — Patient Instructions (Addendum)
MRI to be done for dizziness if labs are normal  Start allopurinol once a day Take the indomethacin daily for another 2 weeks, then use as needed F/U 4 months

## 2018-08-07 LAB — COMPREHENSIVE METABOLIC PANEL
AG Ratio: 1 (calc) (ref 1.0–2.5)
ALT: 13 U/L (ref 6–29)
AST: 19 U/L (ref 10–35)
Albumin: 3.6 g/dL (ref 3.6–5.1)
Alkaline phosphatase (APISO): 77 U/L (ref 37–153)
BUN: 21 mg/dL (ref 7–25)
CO2: 26 mmol/L (ref 20–32)
Calcium: 9.2 mg/dL (ref 8.6–10.4)
Chloride: 104 mmol/L (ref 98–110)
Creat: 0.91 mg/dL (ref 0.50–0.99)
Globulin: 3.6 g/dL (calc) (ref 1.9–3.7)
Glucose, Bld: 76 mg/dL (ref 65–99)
Potassium: 4.3 mmol/L (ref 3.5–5.3)
Sodium: 139 mmol/L (ref 135–146)
Total Bilirubin: 0.7 mg/dL (ref 0.2–1.2)
Total Protein: 7.2 g/dL (ref 6.1–8.1)

## 2018-08-07 LAB — HEMOGLOBIN A1C
Hgb A1c MFr Bld: 5.6 % of total Hgb (ref <5.7)
Mean Plasma Glucose: 114 (calc)
eAG (mmol/L): 6.3 (calc)

## 2018-08-07 LAB — CBC WITH DIFFERENTIAL/PLATELET
Absolute Monocytes: 277 cells/uL (ref 200–950)
Basophils Absolute: 21 cells/uL (ref 0–200)
Basophils Relative: 0.5 %
Eosinophils Absolute: 71 cells/uL (ref 15–500)
Eosinophils Relative: 1.7 %
HCT: 40 % (ref 35.0–45.0)
Hemoglobin: 13.2 g/dL (ref 11.7–15.5)
Lymphs Abs: 2092 cells/uL (ref 850–3900)
MCH: 28.3 pg (ref 27.0–33.0)
MCHC: 33 g/dL (ref 32.0–36.0)
MCV: 85.8 fL (ref 80.0–100.0)
MPV: 10.2 fL (ref 7.5–12.5)
Monocytes Relative: 6.6 %
Neutro Abs: 1739 cells/uL (ref 1500–7800)
Neutrophils Relative %: 41.4 %
Platelets: 245 10*3/uL (ref 140–400)
RBC: 4.66 10*6/uL (ref 3.80–5.10)
RDW: 13.9 % (ref 11.0–15.0)
Total Lymphocyte: 49.8 %
WBC: 4.2 10*3/uL (ref 3.8–10.8)

## 2018-08-07 LAB — URIC ACID: Uric Acid, Serum: 10 mg/dL — ABNORMAL HIGH (ref 2.5–7.0)

## 2018-08-29 ENCOUNTER — Ambulatory Visit
Admission: RE | Admit: 2018-08-29 | Discharge: 2018-08-29 | Disposition: A | Discharge status: Home / Self Care | Payer: BC Managed Care – PPO | Source: Ambulatory Visit | Attending: Family Medicine | Admitting: Family Medicine

## 2018-08-29 ENCOUNTER — Other Ambulatory Visit: Payer: Self-pay

## 2018-08-29 DIAGNOSIS — R519 Headache, unspecified: Secondary | ICD-10-CM

## 2018-08-29 DIAGNOSIS — R29818 Other symptoms and signs involving the nervous system: Secondary | ICD-10-CM

## 2018-08-29 DIAGNOSIS — R42 Dizziness and giddiness: Secondary | ICD-10-CM

## 2018-10-18 ENCOUNTER — Other Ambulatory Visit: Payer: Self-pay | Admitting: Family Medicine

## 2018-12-07 ENCOUNTER — Ambulatory Visit: Payer: BC Managed Care – PPO | Admitting: Family Medicine

## 2018-12-07 ENCOUNTER — Other Ambulatory Visit: Payer: Self-pay

## 2018-12-07 ENCOUNTER — Encounter: Payer: Self-pay | Admitting: Family Medicine

## 2018-12-07 VITALS — BP 126/72 | HR 84 | Temp 98.2°F | Resp 16 | Ht 62.5 in | Wt 248.0 lb

## 2018-12-07 DIAGNOSIS — Z23 Encounter for immunization: Secondary | ICD-10-CM | POA: Diagnosis not present

## 2018-12-07 DIAGNOSIS — I10 Essential (primary) hypertension: Secondary | ICD-10-CM

## 2018-12-07 DIAGNOSIS — M1A9XX0 Chronic gout, unspecified, without tophus (tophi): Secondary | ICD-10-CM | POA: Diagnosis not present

## 2018-12-07 DIAGNOSIS — R609 Edema, unspecified: Secondary | ICD-10-CM | POA: Diagnosis not present

## 2018-12-07 NOTE — Patient Instructions (Addendum)
F/U 4 months for Physical  Flu shot given  

## 2018-12-07 NOTE — Progress Notes (Signed)
   Subjective:    Patient ID: Andrea Eaton, female    DOB: 01-Jun-1955, 63 y.o.   MRN: [id number]  Patient presents for Follow-up (is fasting)  Pt here to f/u chronic medical problems   Medications reviewed No new concerns    Peripheral edema- taking demadex    Chronic insomnia- rare use of traodone   GOut- taking allopurinol , no recent exacerbations    Due for flu shot    Weight down 13lbs , she has been changing diet    Review Of Systems:  GEN- denies fatigue, fever, weight loss,weakness, recent illness HEENT- denies eye drainage, change in vision, nasal discharge, CVS- denies chest pain, palpitations RESP- denies SOB, cough, wheeze ABD- denies N/V, change in stools, abd pain GU- denies dysuria, hematuria, dribbling, incontinence MSK- denies joint pain, muscle aches, injury Neuro- denies headache, dizziness, syncope, seizure activity       Objective:    BP 126/72   Pulse 84   Temp 98.2 F (36.8 C) (Oral)   Resp 16   Ht 5' 2.5" (1.588 m)   Wt 248 lb (112.5 kg)   SpO2 100%   BMI 44.64 kg/m  GEN- NAD, alert and oriented x3 HEENT- PERRL, EOMI, non injected sclera, pink conjunctiva, MMM, oropharynx clear Neck- Supple, no thyromegaly CVS- RRR, no murmur RESP-CTAB ABD-NABS,soft,NT,ND EXT- bilat ankle edema R >L  Pulses- Radial, DP- 2+        Assessment & Plan:      Problem List Items Addressed This Visit      Unprioritized   Borderline diabetes   Essential hypertension - Primary    Well controlled no changes       Relevant Orders   CBC with Differential/Platelet (Completed)   Basic metabolic panel (Completed)   Gout    No recent flares      Relevant Orders   Uric Acid (Completed)   Morbid obesity (Andrea Eaton)    She is working on dietary changes      Peripheral edema    Continue demadex , not wearing compression hose       Other Visit Diagnoses    Need for immunization against influenza       Relevant Orders   Flu Vaccine QUAD 36+ mos IM  (Completed)      Note: This dictation was prepared with Dragon dictation along with smaller phrase technology. Any transcriptional errors that result from this process are unintentional.

## 2018-12-08 ENCOUNTER — Encounter: Payer: Self-pay | Admitting: Family Medicine

## 2018-12-08 LAB — CBC WITH DIFFERENTIAL/PLATELET
Absolute Monocytes: 389 cells/uL (ref 200–950)
Basophils Absolute: 29 cells/uL (ref 0–200)
Basophils Relative: 0.6 %
Eosinophils Absolute: 58 cells/uL (ref 15–500)
Eosinophils Relative: 1.2 %
HCT: 41.3 % (ref 35.0–45.0)
Hemoglobin: 13.7 g/dL (ref 11.7–15.5)
Lymphs Abs: 2299 cells/uL (ref 850–3900)
MCH: 29 pg (ref 27.0–33.0)
MCHC: 33.2 g/dL (ref 32.0–36.0)
MCV: 87.3 fL (ref 80.0–100.0)
MPV: 10.7 fL (ref 7.5–12.5)
Monocytes Relative: 8.1 %
Neutro Abs: 2026 cells/uL (ref 1500–7800)
Neutrophils Relative %: 42.2 %
Platelets: 279 10*3/uL (ref 140–400)
RBC: 4.73 10*6/uL (ref 3.80–5.10)
RDW: 14.6 % (ref 11.0–15.0)
Total Lymphocyte: 47.9 %
WBC: 4.8 10*3/uL (ref 3.8–10.8)

## 2018-12-08 LAB — BASIC METABOLIC PANEL
BUN: 15 mg/dL (ref 7–25)
CO2: 27 mmol/L (ref 20–32)
Calcium: 9.5 mg/dL (ref 8.6–10.4)
Chloride: 104 mmol/L (ref 98–110)
Creat: 0.91 mg/dL (ref 0.50–0.99)
Glucose, Bld: 83 mg/dL (ref 65–99)
Potassium: 3.9 mmol/L (ref 3.5–5.3)
Sodium: 142 mmol/L (ref 135–146)

## 2018-12-08 LAB — URIC ACID: Uric Acid, Serum: 7.4 mg/dL — ABNORMAL HIGH (ref 2.5–7.0)

## 2018-12-08 NOTE — Assessment & Plan Note (Signed)
Well controlled no changes 

## 2018-12-08 NOTE — Assessment & Plan Note (Signed)
Continue demadex , not wearing compression hose

## 2018-12-08 NOTE — Assessment & Plan Note (Signed)
No recent flares 

## 2018-12-08 NOTE — Assessment & Plan Note (Signed)
She is working on dietary changes

## 2018-12-10 ENCOUNTER — Encounter: Payer: Self-pay | Admitting: *Deleted

## 2019-01-13 ENCOUNTER — Other Ambulatory Visit: Payer: Self-pay | Admitting: Family Medicine

## 2019-01-26 ENCOUNTER — Other Ambulatory Visit: Payer: Self-pay | Admitting: Family Medicine

## 2019-02-09 ENCOUNTER — Other Ambulatory Visit: Payer: Self-pay | Admitting: Family Medicine

## 2019-04-12 ENCOUNTER — Other Ambulatory Visit: Payer: Self-pay

## 2019-04-12 ENCOUNTER — Ambulatory Visit (INDEPENDENT_AMBULATORY_CARE_PROVIDER_SITE_OTHER): Payer: BC Managed Care – PPO | Admitting: Family Medicine

## 2019-04-12 ENCOUNTER — Encounter: Payer: Self-pay | Admitting: Family Medicine

## 2019-04-12 VITALS — BP 128/68 | HR 86 | Temp 97.9°F | Resp 12 | Ht 62.5 in | Wt 249.0 lb

## 2019-04-12 DIAGNOSIS — R1011 Right upper quadrant pain: Secondary | ICD-10-CM

## 2019-04-12 DIAGNOSIS — R7303 Prediabetes: Secondary | ICD-10-CM

## 2019-04-12 DIAGNOSIS — Z8249 Family history of ischemic heart disease and other diseases of the circulatory system: Secondary | ICD-10-CM

## 2019-04-12 DIAGNOSIS — R609 Edema, unspecified: Secondary | ICD-10-CM

## 2019-04-12 DIAGNOSIS — M8949 Other hypertrophic osteoarthropathy, multiple sites: Secondary | ICD-10-CM

## 2019-04-12 DIAGNOSIS — Z1231 Encounter for screening mammogram for malignant neoplasm of breast: Secondary | ICD-10-CM

## 2019-04-12 DIAGNOSIS — Z0000 Encounter for general adult medical examination without abnormal findings: Secondary | ICD-10-CM | POA: Diagnosis not present

## 2019-04-12 DIAGNOSIS — R079 Chest pain, unspecified: Secondary | ICD-10-CM

## 2019-04-12 DIAGNOSIS — R2681 Unsteadiness on feet: Secondary | ICD-10-CM

## 2019-04-12 DIAGNOSIS — I10 Essential (primary) hypertension: Secondary | ICD-10-CM

## 2019-04-12 DIAGNOSIS — N3001 Acute cystitis with hematuria: Secondary | ICD-10-CM | POA: Diagnosis not present

## 2019-04-12 DIAGNOSIS — M159 Polyosteoarthritis, unspecified: Secondary | ICD-10-CM

## 2019-04-12 DIAGNOSIS — Z124 Encounter for screening for malignant neoplasm of cervix: Secondary | ICD-10-CM

## 2019-04-12 DIAGNOSIS — N950 Postmenopausal bleeding: Secondary | ICD-10-CM

## 2019-04-12 LAB — MICROSCOPIC MESSAGE

## 2019-04-12 LAB — URINALYSIS, ROUTINE W REFLEX MICROSCOPIC
Bilirubin Urine: NEGATIVE
Glucose, UA: NEGATIVE
Hyaline Cast: NONE SEEN /LPF
Ketones, ur: NEGATIVE
Nitrite: NEGATIVE
Protein, ur: NEGATIVE
Specific Gravity, Urine: 1.014 (ref 1.001–1.03)
pH: 8 (ref 5.0–8.0)

## 2019-04-12 LAB — WET PREP FOR TRICH, YEAST, CLUE

## 2019-04-12 MED ORDER — NITROFURANTOIN MONOHYD MACRO 100 MG PO CAPS: 100.0000 mg | ORAL_CAPSULE | Freq: Two times a day (BID) | ORAL | 0 refills | Status: DC

## 2019-04-12 NOTE — Assessment & Plan Note (Signed)
Check A1C 

## 2019-04-12 NOTE — Assessment & Plan Note (Signed)
She has known OA, and chronic back issues, her weight is worsening the situation, but she knows they wont operate on her

## 2019-04-12 NOTE — Assessment & Plan Note (Signed)
[place]

## 2019-04-12 NOTE — Progress Notes (Signed)
Subjective:    Patient ID: Andrea Eaton, female    DOB: 1955/06/18, 64 y.o.   MRN: [id number]  Patient presents for Annual Exam (is fasting) and Chest Pain (hainess on chest- some SOB)  Pt here for CPE, medications and history reviewed Mammogram done iN feb 2020 Pap sMEAR utd COLONOSCOPY UTD  She has had intermittent episodes of chest pain on and off for quite some time.  Her last severe episode was January for states that she was leaning over the bed started having severe chest pressure that lasted longer than her typical episodes.  She does not recall if she was short of breath or diaphoresis as as it was over a month ago.  She has not had any further episodes since then.  However her sister passed away 2 weeks ago secondary to heart attack at age 52.  She is also concerned about tenderness beneath her right breast area/right upper quadrant no change with eating no change in bowel movements.  She does not feel a knot in the breast but she is due for a mammogram.  This has been present for a while.  She has been dragging her right leg more.  She notes that she has severe osteoarthritis of the knee and needs knee replacement but due to her weight surgeon would not operate on her at this time.  She is also noticed increased back pain due to favoring 1 side over the other.  Another major concern is vaginal bleeding -she has had some streaks of vaginal bleeding when she wipes randomly, and cramps, no rectal bleeding  She will get low back discomfort before and after urinating  Last episode was this morning more pink tinged spotting  Has mild odor but normal burning    She also noted that she had a bruise on her left leg that moved up.  And she has more varicose veins.  She has been trying to wear compression stockings  She Has noted that since she has been trying to rest more with everything going on she has not had as much peripheral edema but she does take her Demadex     Review Of  Systems:  GEN- denies fatigue, fever, weight loss,weakness, recent illness HEENT- denies eye drainage, change in vision, nasal discharge, CVS- + chest pain, palpitations RESP- denies SOB, cough, wheeze ABD- denies N/V, change in stools, abd pain GU- denies dysuria, hematuria, dribbling, incontinence MSK- denies joint pain, muscle aches, injury Neuro- denies headache, dizziness, syncope, seizure activity       Objective:    BP 128/68   Pulse 86   Temp 97.9 F (36.6 C) (Temporal)   Resp 12   Ht 5' 2.5" (1.588 m)   Wt 249 lb (112.9 kg)   SpO2 100%   BMI 44.82 kg/m  GEN- NAD, alert and oriented x3 HEENT- PERRL, EOMI, non injected sclera, pink conjunctiva, MMM, oropharynx clear Neck- Supple, no thyromegaly Breast- normal symmetry, no nipple inversion,no nipple drainage, no nodules or lumps felt Nodes- no axillary nodes CVS- RRR, no murmur RESP-CTAB ABD-NABS,soft,mild TTP over right lower ribs, no rebound, no guarding ,ND GU- normal external genitalia, vaginal mucosa pink and moist, difficult to visualize cervix but blood streaked mucous seen from os   os, minimal , no CMT, did not palpate ovarian masses, uterus difficult to palpate EXT- trace ankle  Edema , varicose veins bilat  Neuro- unstable gait, walks with right foot turned out some, and limping Pulses- Radial, DP- palpated  EKG- NSR, no ST changes     Fall/depressoin. Cage screen negative Assessment & Plan:      Problem List Items Addressed This Visit      Unprioritized   Borderline diabetes    Check A1C      Relevant Orders   Hemoglobin A1c   Essential hypertension   Relevant Orders   Lipid panel   TSH   Gait instability    She has known OA, and chronic back issues, her weight is worsening the situation, but she knows they wont operate on her      OA (osteoarthritis)   Peripheral edema    CONTINUE COMPRESSION HOSE DEMDADEX      Routine general medical examination at a health care facility - Primary     CPE done, pt to schedule mammogram  No specirfic cause of the RUQ pain, pain not in the breast, no mass palpated, no GI symptoms to suggest gallbladder etiology??  Post menopausal bleeding per below, referral to GYN for further work up      Relevant Orders   CBC with Differential/Platelet   Comprehensive metabolic panel   Lipid panel    Other Visit Diagnoses    Chest pain, unspecified type       recurrent chest pain, with family histor of CAD, would benefit from nuclear stress testing, referral to cardiology, BP normal   Relevant Orders   EKG 12-Lead (Completed)   CBC with Differential/Platelet   Comprehensive metabolic panel   Ambulatory referral to Cardiology   Acute cystitis with hematuria       blood likley vaginal but with symptoms will start macrobid, send for culture   Relevant Orders   Urinalysis, Routine w reflex microscopic (Completed)   Urine Culture   Post-menopausal bleeding       referral to GYN, PAP obtained but very difficult to see anatomy due to habitus, wet prep neg, but gross blood seen from os   Relevant Orders   WET PREP FOR [place], YEAST, CLUE (Completed)   Pap IG w/ reflex to HPV when ASC-U   TSH   Ambulatory referral to Gynecology   Encounter for screening mammogram for malignant neoplasm of breast       Relevant Orders   MM 3D SCREEN BREAST BILATERAL   Cervical cancer screening       Relevant Orders   Pap IG w/ reflex to HPV when ASC-U   RUQ pain       Family history of heart disease       Relevant Orders   Ambulatory referral to Cardiology      Note: This dictation was prepared with Dragon dictation along with smaller phrase technology. Any transcriptional errors that result from this process are unintentional.

## 2019-04-12 NOTE — Assessment & Plan Note (Addendum)
CPE done, pt to schedule mammogram  No specirfic cause of the RUQ pain, pain not in the breast, no mass palpated, no GI symptoms to suggest gallbladder etiology??  Post menopausal bleeding per below, referral to GYN for further work up

## 2019-04-12 NOTE — Patient Instructions (Addendum)
We will call with results Referral to GYN for bleeding episodes Referral to cardiology  Schedule your mammogram  Take antibiotics as prescribed take Macrobid  F/U 4 months

## 2019-04-13 LAB — URINE CULTURE
MICRO NUMBER:: 10172945
Result:: NO GROWTH
SPECIMEN QUALITY:: ADEQUATE

## 2019-04-14 LAB — COMPREHENSIVE METABOLIC PANEL
AG Ratio: 1.1 (calc) (ref 1.0–2.5)
ALT: 16 U/L (ref 6–29)
AST: 19 U/L (ref 10–35)
Albumin: 3.7 g/dL (ref 3.6–5.1)
Alkaline phosphatase (APISO): 86 U/L (ref 37–153)
BUN: 19 mg/dL (ref 7–25)
CO2: 27 mmol/L (ref 20–32)
Calcium: 9 mg/dL (ref 8.6–10.4)
Chloride: 102 mmol/L (ref 98–110)
Creat: 0.81 mg/dL (ref 0.50–0.99)
Globulin: 3.3 g/dL (calc) (ref 1.9–3.7)
Glucose, Bld: 85 mg/dL (ref 65–99)
Potassium: 3.8 mmol/L (ref 3.5–5.3)
Sodium: 139 mmol/L (ref 135–146)
Total Bilirubin: 0.7 mg/dL (ref 0.2–1.2)
Total Protein: 7 g/dL (ref 6.1–8.1)

## 2019-04-14 LAB — CBC WITH DIFFERENTIAL/PLATELET
Absolute Monocytes: 343 cells/uL (ref 200–950)
Basophils Absolute: 21 cells/uL (ref 0–200)
Basophils Relative: 0.4 %
Eosinophils Absolute: 52 cells/uL (ref 15–500)
Eosinophils Relative: 1 %
HCT: 40.4 % (ref 35.0–45.0)
Hemoglobin: 13.4 g/dL (ref 11.7–15.5)
Lymphs Abs: 2220 cells/uL (ref 850–3900)
MCH: 28.5 pg (ref 27.0–33.0)
MCHC: 33.2 g/dL (ref 32.0–36.0)
MCV: 85.8 fL (ref 80.0–100.0)
MPV: 11.4 fL (ref 7.5–12.5)
Monocytes Relative: 6.6 %
Neutro Abs: 2564 cells/uL (ref 1500–7800)
Neutrophils Relative %: 49.3 %
Platelets: 211 10*3/uL (ref 140–400)
RBC: 4.71 10*6/uL (ref 3.80–5.10)
RDW: 13.7 % (ref 11.0–15.0)
Total Lymphocyte: 42.7 %
WBC: 5.2 10*3/uL (ref 3.8–10.8)

## 2019-04-14 LAB — PAP IG W/ RFLX HPV ASCU

## 2019-04-14 LAB — LIPID PANEL
Cholesterol: 154 mg/dL (ref <200)
HDL: 63 mg/dL (ref >=50)
LDL Cholesterol (Calc): 78 mg/dL (calc)
Non-HDL Cholesterol (Calc): 91 mg/dL (calc) (ref <130)
Total CHOL/HDL Ratio: 2.4 (calc) (ref <5.0)
Triglycerides: 54 mg/dL (ref <150)

## 2019-04-14 LAB — TSH: TSH: 3.27 mIU/L (ref 0.40–4.50)

## 2019-04-14 LAB — HEMOGLOBIN A1C
Hgb A1c MFr Bld: 5.7 % of total Hgb — ABNORMAL HIGH (ref <5.7)
Mean Plasma Glucose: 117 (calc)
eAG (mmol/L): 6.5 (calc)

## 2019-04-15 ENCOUNTER — Other Ambulatory Visit: Payer: Self-pay

## 2019-04-15 ENCOUNTER — Ambulatory Visit (HOSPITAL_COMMUNITY)
Admission: RE | Admit: 2019-04-15 | Discharge: 2019-04-15 | Disposition: A | Discharge status: Home / Self Care | Payer: BC Managed Care – PPO | Source: Ambulatory Visit | Attending: Family Medicine | Admitting: Family Medicine

## 2019-04-15 DIAGNOSIS — Z1231 Encounter for screening mammogram for malignant neoplasm of breast: Secondary | ICD-10-CM | POA: Insufficient documentation

## 2019-04-16 ENCOUNTER — Ambulatory Visit: Payer: BC Managed Care – PPO | Admitting: Cardiology

## 2019-04-16 ENCOUNTER — Encounter: Payer: Self-pay | Admitting: Cardiology

## 2019-04-16 VITALS — BP 134/74 | HR 85 | Temp 97.9°F | Ht 62.0 in | Wt 251.0 lb

## 2019-04-16 DIAGNOSIS — R002 Palpitations: Secondary | ICD-10-CM

## 2019-04-16 DIAGNOSIS — R079 Chest pain, unspecified: Secondary | ICD-10-CM | POA: Diagnosis not present

## 2019-04-16 DIAGNOSIS — R0789 Other chest pain: Secondary | ICD-10-CM

## 2019-04-16 MED ORDER — METOPROLOL SUCCINATE ER 100 MG PO TB24: ORAL_TABLET | ORAL | 0 refills | Status: DC

## 2019-04-16 MED ORDER — METOPROLOL TARTRATE 100 MG PO TABS: ORAL_TABLET | ORAL | 0 refills | Status: DC

## 2019-04-16 NOTE — Patient Instructions (Signed)
Medication Instructions:  Take metoprolol 100 mg - two hours prior to procedure   Labwork: bmet   Testing/Procedures: Coronary CT  Follow-Up: Your physician recommends that you schedule a follow-up appointment in: pending test results    Any Other Special Instructions Will Be Listed Below (If Applicable).     If you need a refill on your cardiac medications before your next appointment, please call your pharmacy.

## 2019-04-16 NOTE — Progress Notes (Signed)
Clinical Summary Ms. Bulman is a 64 y.o.female seen as new consult, referred by Dr Buelah Manis for the following medical problems.   1. Chest pain - on and off for about 1 year - worst episodes on new years day - upper midchest. Pressure, 9/10 in severity. Started after getting out of bed. Had some nausea. Pain lasted for 10 minutes. Not positional. No relation to food.  - no significnat pain since then  - some SOB/DOE, sedentary due knee pains.  - has had some LE edema. On diuretic for about 2 years. Takes torsemide 20mg  daily which seems to contorl     CAD risk factors: borderline DM, sister MI mid 64s.     2. LE edema - 01/2016 echo LVEF 0000000, normal diastolic paramaters, mod LAE - has had some LE edema. On diuretic for about 2 years. Takes torsemide 20mg  daily which seems to contorl     3. Palpitations - occurs daily. Can occur at rest or with exertion. Lasts just a few seconds - limited caffeien intake, worst when she takes coffee  4. Leg pains - pain right calf with walking - bilateral calves, R>L    Past Medical History:  Diagnosis Date  . Arthritis    Dr. Sharol Given  . Hypertension      No Known Allergies   Current Outpatient Medications  Medication Sig Dispense Refill  . allopurinol (ZYLOPRIM) 100 MG tablet TAKE 1 TABLET BY MOUTH EVERY DAY 90 tablet 0  . ibuprofen (ADVIL) 800 MG tablet TAKE 1 TABLET BY MOUTH EVERY 8 HOURS AS NEEDED 30 tablet 1  . Multiple Vitamins-Minerals (CENTRUM SILVER ULTRA WOMENS) TABS Take 1 tablet by mouth daily.    . nitrofurantoin, macrocrystal-monohydrate, (MACROBID) 100 MG capsule Take 1 capsule (100 mg total) by mouth 2 (two) times daily. 14 capsule 0  . Potassium 99 MG TABS Take 99 mg by mouth daily.    Marland Kitchen torsemide (DEMADEX) 20 MG tablet TAKE 1 TABLET (20 MG TOTAL) BY MOUTH 2 (TWO) TIMES DAILY AS NEEDED. 180 tablet 1  . vitamin C (ASCORBIC ACID) 500 MG tablet Take 500 mg by mouth daily.     No current facility-administered  medications for this visit.     Past Surgical History:  Procedure Laterality Date  . COLONOSCOPY  07/2007   Dr. Fuller Plan: diverticulosis, internal hemorrhoids  . COLONOSCOPY N/A 03/11/2018   Procedure: COLONOSCOPY;  Surgeon: Danie Binder, MD;  Location: [facility];  Service: Endoscopy;  Laterality: N/A;  9:30am  . ESOPHAGOGASTRODUODENOSCOPY  07/2007   Dr. Fuller Plan: H.pylori gastritis  . POLYPECTOMY  03/11/2018   Procedure: POLYPECTOMY;  Surgeon: Danie Binder, MD;  Location: [facility];  Service: Endoscopy;;  descending colon     No Known Allergies    Family History  Problem Relation Age of Onset  . Hypertension Mother   . Diabetes Mother   . Hypertension Father   . Heart disease Father   . Diabetes Sister   . Hypertension Sister   . Hyperlipidemia Sister   . Heart disease Sister   . Hypertension Brother   . Diabetes Brother   . Colon cancer Neg Hx      Social History Ms. Tewksbury reports that she has never smoked. She has never used smokeless tobacco. Ms. Holdaway reports no history of alcohol use.   Review of Systems CONSTITUTIONAL: No weight loss, fever, chills, weakness or fatigue.  HEENT: Eyes: No visual loss, blurred vision, double vision or yellow  sclerae.No hearing loss, sneezing, congestion, runny nose or sore throat.  SKIN: No rash or itching.  CARDIOVASCULAR: per hpi RESPIRATORY: No shortness of breath, cough or sputum.  GASTROINTESTINAL: No anorexia, nausea, vomiting or diarrhea. No abdominal pain or blood.  GENITOURINARY: No burning on urination, no polyuria NEUROLOGICAL: No headache, dizziness, syncope, paralysis, ataxia, numbness or tingling in the extremities. No change in bowel or bladder control.  MUSCULOSKELETAL: per hpi LYMPHATICS: No enlarged nodes. No history of splenectomy.  PSYCHIATRIC: No history of depression or anxiety.  ENDOCRINOLOGIC: No reports of sweating, cold or heat intolerance. No polyuria or polydipsia.  Marland Kitchen   Physical  Examination Today's Vitals   04/16/19 1444  BP: 134/74  Pulse: 85  Temp: 97.9 F (36.6 C)  TempSrc: Temporal  Weight: 251 lb (113.9 kg)  Height: 5\' 2"  (1.575 m)   Body mass index is 45.91 kg/m.  Gen: resting comfortably, no acute distress HEENT: no scleral icterus, pupils equal round and reactive, no palptable cervical adenopathy,  CV: RRR, no m/r/g, no jvd Resp: Clear to auscultation bilaterally GI: abdomen is soft, non-tender, non-distended, normal bowel sounds, no hepatosplenomegaly MSK: extremities are warm, no edema.  Skin: warm, no rash Neuro:  no focal deficits Psych: appropriate affect       Assessment and Plan  1. Chest pain - unclear etiology, mixed in description - cannot run on treadmill due to severe knee pains. With her body habitus I think nuclear and echo imaging will be very limited. Would plan for a coronary CTA to further evaluate  2.Palpitations - mild symptoms, monitor at this time  3. Leg pains  we discussed possible ABIs, she elects to hold off at this time        Arnoldo Lenis, M.D

## 2019-04-27 ENCOUNTER — Telehealth: Payer: Self-pay | Admitting: Obstetrics and Gynecology

## 2019-04-27 NOTE — Telephone Encounter (Signed)

## 2019-04-28 ENCOUNTER — Encounter: Payer: Self-pay | Admitting: Obstetrics and Gynecology

## 2019-04-28 ENCOUNTER — Other Ambulatory Visit: Payer: Self-pay

## 2019-04-28 ENCOUNTER — Ambulatory Visit (INDEPENDENT_AMBULATORY_CARE_PROVIDER_SITE_OTHER): Payer: BC Managed Care – PPO | Admitting: Obstetrics and Gynecology

## 2019-04-28 VITALS — BP 130/75 | HR 77 | Ht 62.0 in | Wt 250.2 lb

## 2019-04-28 DIAGNOSIS — N950 Postmenopausal bleeding: Secondary | ICD-10-CM | POA: Diagnosis not present

## 2019-04-28 NOTE — Patient Instructions (Signed)
Endometrial Biopsy  Endometrial biopsy is a procedure in which a tissue sample is taken from inside the uterus. The sample is taken from the endometrium, which is the lining of the uterus. The tissue sample is then checked under a microscope to see if the tissue is normal or abnormal. This procedure helps to determine where you are in your menstrual cycle and how hormone levels are affecting the lining of the uterus. This procedure may also be used to evaluate uterine bleeding or to diagnose endometrial cancer, endometrial tuberculosis, polyps, or other inflammatory conditions. Tell a health care provider about:  Any allergies you have.  All medicines you are taking, including vitamins, herbs, eye drops, creams, and over-the-counter medicines.  Any problems you or family members have had with anesthetic medicines.  Any blood disorders you have.  Any surgeries you have had.  Any medical conditions you have.  Whether you are pregnant or may be pregnant. What are the risks? Generally, this is a safe procedure. However, problems may occur, including:  Bleeding.  Pelvic infection.  Puncture of the wall of the uterus with the biopsy device (rare). What happens before the procedure?  Keep a record of your menstrual cycles as told by your health care provider. You may need to schedule your procedure for a specific time in your cycle.  You may want to bring a sanitary pad to wear after the procedure.  Ask your health care provider about: ? Changing or stopping your regular medicines. This is especially important if you are taking diabetes medicines or blood thinners. ? Taking medicines such as aspirin and ibuprofen. These medicines can thin your blood. Do not take these medicines before your procedure if your health care provider instructs you not to.  Plan to have someone take you home from the hospital or clinic. What happens during the procedure?  To lower your risk of  infection: ? Your health care team will wash or sanitize their hands.  You will lie on an exam table with your feet and legs supported as in a pelvic exam.  Your health care provider will insert an instrument (speculum) into your vagina to see your cervix.  Your cervix will be cleansed with an antiseptic solution.  A medicine (local anesthetic) will be used to numb the cervix.  A forceps instrument (tenaculum) will be used to hold your cervix steady for the biopsy.  A thin, rod-like instrument (uterine sound) will be inserted through your cervix to determine the length of your uterus and the location where the biopsy sample will be removed.  A thin, flexible tube (catheter) will be inserted through your cervix and into the uterus. The catheter will be used to collect the biopsy sample from your endometrial tissue.  The catheter and speculum will then be removed, and the tissue sample will be sent to a lab for examination. What happens after the procedure?  You will rest in a recovery area until you are ready to go home.  You may have mild cramping and a small amount of vaginal bleeding. This is normal.  It is up to you to get the results of your procedure. Ask your health care provider, or the department that is doing the procedure, when your results will be ready. Summary  Endometrial biopsy is a procedure in which a tissue sample is taken from the endometrium, which is the lining of the uterus.  This procedure may help to diagnose menstrual cycle problems, abnormal bleeding, or other conditions affecting   the endometrium.  Before the procedure, keep a record of your menstrual cycles as told by your health care provider.  The tissue sample that is removed will be checked under a microscope to see if it is normal or abnormal. This information is not intended to replace advice given to you by your health care provider. Make sure you discuss any questions you have with your health care  provider. Document Revised: 01/17/2017 Document Reviewed: 02/21/2016 Elsevier Patient Education  2020 [employer]  

## 2019-04-28 NOTE — Progress Notes (Signed)
Patient ID: Andrea Eaton, female   DOB: [birth year], 64 y.o.   MRN: [id number]    [facility] Visit  @DATE @            Patient name: Andrea Eaton MRN [id number]  Date of birth: 1955/12/25  CC & HPI:  Andrea Eaton is a 64 y.o. female presenting today for two-year history of post-menopausal bleeding. She notices the blood mostly when she wipes. She occasionally has to use a pad. She has not had a hysterectomy and is not on any hormone replacement therapy. Most recent PAP smear on 04/12/2019 was normal.  ROS:  ROS   + vaginal bleeding   Pertinent History Reviewed:   Reviewed Medical         Past Medical History:  Diagnosis Date  . Arthritis    Dr. Sharol Given  . Hypertension                               Surgical Hx:    Past Surgical History:  Procedure Laterality Date  . COLONOSCOPY  07/2007   Dr. Fuller Plan: diverticulosis, internal hemorrhoids  . COLONOSCOPY N/A 03/11/2018   Procedure: COLONOSCOPY;  Surgeon: Danie Binder, MD;  [facility];  Service: Endoscopy;  Laterality: N/A;  9:30am  . ESOPHAGOGASTRODUODENOSCOPY  07/2007   Dr. Fuller Plan: H.pylori gastritis  . POLYPECTOMY  03/11/2018   Procedure: POLYPECTOMY;  Surgeon: Danie Binder, MD;  Location: [facility];  Service: Endoscopy;;  descending colon   Medications: Reviewed & Updated - see associated section                       Current Outpatient Medications:  .  allopurinol (ZYLOPRIM) 100 MG tablet, TAKE 1 TABLET BY MOUTH EVERY DAY, Disp: 90 tablet, Rfl: 0 .  ibuprofen (ADVIL) 800 MG tablet, TAKE 1 TABLET BY MOUTH EVERY 8 HOURS AS NEEDED, Disp: 30 tablet, Rfl: 1 .  metoprolol tartrate (LOPRESSOR) 100 MG tablet, Take 1 tablet two hours prior to procedure., Disp: 1 tablet, Rfl: 0 .  Multiple Vitamins-Minerals (CENTRUM SILVER ULTRA WOMENS) TABS, Take 1 tablet by mouth daily., Disp: , Rfl:  .  Potassium 99 MG TABS, Take 99 mg by mouth daily., Disp: , Rfl:  .  torsemide (DEMADEX) 20 MG tablet, TAKE 1 TABLET (20  MG TOTAL) BY MOUTH 2 (TWO) TIMES DAILY AS NEEDED., Disp: 180 tablet, Rfl: 1 .  vitamin C (ASCORBIC ACID) 500 MG tablet, Take 500 mg by mouth daily., Disp: , Rfl:  .  nitrofurantoin, macrocrystal-monohydrate, (MACROBID) 100 MG capsule, Take 1 capsule (100 mg total) by mouth 2 (two) times daily. (Patient not taking: Reported on 04/28/2019), Disp: 14 capsule, Rfl: 0   Social History: Reviewed -  reports that she has never smoked. She has never used smokeless tobacco.  Objective Findings:  Vitals: Blood pressure 130/75, pulse 77, height 5\' 2"  (1.575 m), weight 250 lb 3.2 oz (113.5 kg).  PHYSICAL EXAMINATION General appearance - alert, well appearing, and in no distress Mental status - alert, oriented to person, place, and time, normal mood, behavior, speech, dress, motor activity, and thought processes  Chaperoned by Clerance Lav.   Assessment & Plan:   A:  1.  Post-menopausal bleeding  P:  1.  Ultrasound in 1-2 week with follow-up for endometrial biopsy  By signing my name below, I, Clerance Lav, attest that this documentation has been  prepared under the direction and in the presence of Jonnie Kind, MD. Electronically Signed: A[place]. 04/28/19. 11:37 AM.  I personally performed the services described in this documentation, which was SCRIBED in my presence. The recorded information has been reviewed and considered accurate. It has been edited as necessary during review. Jonnie Kind, MD

## 2019-05-11 ENCOUNTER — Other Ambulatory Visit: Payer: Self-pay | Admitting: Obstetrics and Gynecology

## 2019-05-11 ENCOUNTER — Telehealth: Payer: Self-pay | Admitting: Obstetrics & Gynecology

## 2019-05-11 DIAGNOSIS — N950 Postmenopausal bleeding: Secondary | ICD-10-CM

## 2019-05-11 NOTE — Telephone Encounter (Signed)

## 2019-05-12 ENCOUNTER — Other Ambulatory Visit: Payer: Self-pay

## 2019-05-12 ENCOUNTER — Ambulatory Visit (INDEPENDENT_AMBULATORY_CARE_PROVIDER_SITE_OTHER): Payer: BC Managed Care – PPO

## 2019-05-12 DIAGNOSIS — N83311 Acquired atrophy of right ovary: Secondary | ICD-10-CM | POA: Diagnosis not present

## 2019-05-12 DIAGNOSIS — R9389 Abnormal findings on diagnostic imaging of other specified body structures: Secondary | ICD-10-CM

## 2019-05-12 DIAGNOSIS — N950 Postmenopausal bleeding: Secondary | ICD-10-CM | POA: Diagnosis not present

## 2019-05-12 DIAGNOSIS — D251 Intramural leiomyoma of uterus: Secondary | ICD-10-CM

## 2019-05-12 DIAGNOSIS — N83312 Acquired atrophy of left ovary: Secondary | ICD-10-CM

## 2019-05-12 NOTE — Progress Notes (Signed)
PELVIC US TA/TV: heterogeneous anteverted uterus with mult.fibroids (#1) anterior right pedunculated fibroid 3.5 x 3 x 3.2 cm,(#2) posterior subserosal fibroid 2.8 x 2.3 x 3.3 cm,(#3) posterior subserosal fibroid 2 x 3 x 2.6 cm,complex thickened endometrium,EEC 18.8 mm,ovaries appear WNL,limited view of ovaries,no free fluid,no pain during ultrasound

## 2019-05-19 ENCOUNTER — Other Ambulatory Visit (HOSPITAL_COMMUNITY): Payer: Self-pay | Admitting: Cardiology

## 2019-05-19 ENCOUNTER — Telehealth: Payer: Self-pay | Admitting: Obstetrics and Gynecology

## 2019-05-19 DIAGNOSIS — I2510 Atherosclerotic heart disease of native coronary artery without angina pectoris: Secondary | ICD-10-CM

## 2019-05-19 LAB — BASIC METABOLIC PANEL
BUN: 22 mg/dL (ref 7–25)
CO2: 29 mmol/L (ref 20–32)
Calcium: 9.5 mg/dL (ref 8.6–10.4)
Chloride: 99 mmol/L (ref 98–110)
Creat: 0.76 mg/dL (ref 0.50–0.99)
Glucose, Bld: 79 mg/dL (ref 65–99)
Potassium: 4.6 mmol/L (ref 3.5–5.3)
Sodium: 137 mmol/L (ref 135–146)

## 2019-05-19 NOTE — Telephone Encounter (Signed)

## 2019-05-24 ENCOUNTER — Ambulatory Visit: Payer: BC Managed Care – PPO | Admitting: Obstetrics and Gynecology

## 2019-05-24 ENCOUNTER — Other Ambulatory Visit: Payer: Self-pay

## 2019-05-24 ENCOUNTER — Other Ambulatory Visit: Payer: Self-pay | Admitting: Obstetrics and Gynecology

## 2019-05-24 ENCOUNTER — Encounter: Payer: Self-pay | Admitting: Obstetrics and Gynecology

## 2019-05-24 VITALS — BP 122/72 | HR 68 | Ht 62.0 in | Wt 251.8 lb

## 2019-05-24 DIAGNOSIS — N950 Postmenopausal bleeding: Secondary | ICD-10-CM | POA: Diagnosis not present

## 2019-05-24 NOTE — Progress Notes (Signed)
Patient ID: Andrea Eaton, female   DOB: 1955-10-14, 64 y.o.   MRN: [id number]  Patient given informed consent, signed copy in the chart, time out was performed. Appropriate time out taken. . The patient was placed in the lithotomy position and the cervix brought into view with sterile speculum.  Portio of cervix cleansed x 2 with betadine swabs.  A tenaculum was placed in the anterior lip of the cervix.  The uterus was sounded for depth of 10. A pipelle was introduced to into the uterus, suction created,  and a generous endometrial sample was obtained. All equipment was removed and accounted for., fluid and tissue fragments. The patient tolerated the procedure well.    Patient given post procedure instructions. The patient will return in 2 weeks for results. Pt told to expect a call within a week.   By signing my name below, I, [employer], attest that this documentation has been prepared under the direction and in the presence of Jonnie Kind, MD. Electronically Signed: [place]. 05/24/19. 8:44 AM.  I personally performed the services described in this documentation, which was SCRIBED in my presence. The recorded information has been reviewed and considered accurate. It has been edited as necessary during review. Jonnie Kind, MD

## 2019-05-25 ENCOUNTER — Telehealth: Payer: Self-pay | Admitting: Obstetrics and Gynecology

## 2019-05-25 NOTE — Progress Notes (Signed)
Telephone call to patient: biopsy results benign polypoid tissues.  Recommended hysteroscopy Dilation and curettage.  Will ask A Petty to schedule.

## 2019-05-25 NOTE — Telephone Encounter (Signed)
Pt made aware of benign endometrial biopsy of polyps.

## 2019-05-26 ENCOUNTER — Telehealth (HOSPITAL_COMMUNITY): Payer: Self-pay | Admitting: Emergency Medicine

## 2019-05-26 NOTE — Progress Notes (Signed)
Will need a preOP , very brief, within 30 days of surgery

## 2019-05-26 NOTE — Telephone Encounter (Signed)
Reaching out to patient to offer assistance regarding upcoming cardiac imaging study; pt verbalizes understanding of appt date/time, parking situation and where to check in, pre-test NPO status and medications ordered, and verified current allergies; name and call back number provided for further questions should they arise Marchia Bond RN Navigator Cardiac Imaging [facility] [phone] office [phone] cell   Pt informed of appt for micropuncture at 9am to check in at radiology for this.  Pt also instructed to take PO metoprolol 2 hr prior to scan and hold torsemide the day of test.  Pt verbalized understanding and appreciated the call Clarise Cruz

## 2019-05-27 ENCOUNTER — Other Ambulatory Visit (HOSPITAL_COMMUNITY): Payer: Self-pay | Admitting: Cardiology

## 2019-05-27 ENCOUNTER — Other Ambulatory Visit: Payer: Self-pay

## 2019-05-27 ENCOUNTER — Ambulatory Visit (HOSPITAL_COMMUNITY)
Admission: RE | Admit: 2019-05-27 | Discharge: 2019-05-27 | Disposition: A | Discharge status: Home / Self Care | Payer: BC Managed Care – PPO | Source: Ambulatory Visit | Attending: Cardiology | Admitting: Cardiology

## 2019-05-27 DIAGNOSIS — I2510 Atherosclerotic heart disease of native coronary artery without angina pectoris: Secondary | ICD-10-CM | POA: Insufficient documentation

## 2019-05-27 DIAGNOSIS — R079 Chest pain, unspecified: Secondary | ICD-10-CM

## 2019-05-27 HISTORY — PX: IR VENIPUNCTURE 3YRS/OLDER BY MD: IMG2287

## 2019-05-27 HISTORY — PX: IR US GUIDE VASC ACCESS RIGHT: IMG2390

## 2019-05-27 MED ORDER — NITROGLYCERIN 0.4 MG SL SUBL
SUBLINGUAL_TABLET | SUBLINGUAL | Status: AC
  Filled 2019-05-27: qty 2

## 2019-05-27 MED ORDER — LIDOCAINE HCL (PF) 1 % IJ SOLN
INTRAMUSCULAR | Status: AC | PRN
  Administered 2019-05-27: 5 mL

## 2019-05-27 MED ORDER — LIDOCAINE HCL 1 % IJ SOLN
INTRAMUSCULAR | Status: AC
  Filled 2019-05-27: qty 20

## 2019-05-27 MED ORDER — IOHEXOL 350 MG/ML SOLN
100.0000 mL | Freq: Once | INTRAVENOUS | Status: AC | PRN
  Administered 2019-05-27: 100 mL via INTRAVENOUS

## 2019-05-27 NOTE — Procedures (Signed)
Interventional Radiology Procedure Note  Procedure: Placement of a right brachial vein US guided venipuncture.  Complications: None  Recommendations:  - Ok to use    Signed,  Dulcy Fanny. Earleen Newport, DO

## 2019-06-04 ENCOUNTER — Other Ambulatory Visit: Payer: Self-pay | Admitting: Family Medicine

## 2019-06-18 ENCOUNTER — Other Ambulatory Visit: Payer: Self-pay | Admitting: Obstetrics and Gynecology

## 2019-06-22 ENCOUNTER — Telehealth: Payer: Self-pay | Admitting: Obstetrics and Gynecology

## 2019-06-22 NOTE — Telephone Encounter (Signed)

## 2019-06-23 ENCOUNTER — Encounter: Payer: Self-pay | Admitting: Obstetrics and Gynecology

## 2019-06-23 ENCOUNTER — Ambulatory Visit (INDEPENDENT_AMBULATORY_CARE_PROVIDER_SITE_OTHER): Payer: BC Managed Care – PPO | Admitting: Obstetrics and Gynecology

## 2019-06-23 ENCOUNTER — Other Ambulatory Visit: Payer: Self-pay

## 2019-06-23 VITALS — BP 117/80 | HR 84 | Ht 62.0 in | Wt 247.0 lb

## 2019-06-23 DIAGNOSIS — N950 Postmenopausal bleeding: Secondary | ICD-10-CM | POA: Diagnosis not present

## 2019-06-23 DIAGNOSIS — C541 Malignant neoplasm of endometrium: Secondary | ICD-10-CM | POA: Insufficient documentation

## 2019-06-23 DIAGNOSIS — R9389 Abnormal findings on diagnostic imaging of other specified body structures: Secondary | ICD-10-CM | POA: Diagnosis not present

## 2019-06-23 NOTE — Progress Notes (Signed)
Preoperative History and Physical  JEWELIANA Eaton is a 64 y.o. [id number] here for surgical management of post menopausal bleeding.   No significant preoperative concerns.  Proposed surgery: Dilatation and curettage/ Hysteroscopy  Past Medical History:  Diagnosis Date  . Arthritis    Dr. Sharol Given  . Hypertension    Past Surgical History:  Procedure Laterality Date  . COLONOSCOPY  07/2007   Dr. Fuller Plan: diverticulosis, internal hemorrhoids  . COLONOSCOPY N/A 03/11/2018   Procedure: COLONOSCOPY;  Surgeon: Danie Binder, MD;  [facility];  Service: Endoscopy;  Laterality: N/A;  9:30am  . ESOPHAGOGASTRODUODENOSCOPY  07/2007   Dr. Fuller Plan: H.pylori gastritis  . IR US GUIDE VASC ACCESS RIGHT  05/27/2019  . IR VENIPUNCTURE 63YRS/OLDER BY MD  05/27/2019  . POLYPECTOMY  03/11/2018   Procedure: POLYPECTOMY;  Surgeon: Danie Binder, MD;  [facility]: [facility];  Service: Endoscopy;;  descending colon   OB History  Gravida Para Term Preterm AB Living  3       1 3   SAB TAB Ectopic Multiple Live Births  1       2    # Outcome Date GA Lbr Len/2nd Weight Sex Delivery Anes PTL Lv  3 Gravida     M Vag-Spont   LIV  2 Gravida     F Vag-Spont   LIV  1 SAB         FD  Patient denies any other pertinent gynecologic issues.   Current Outpatient Medications on File Prior to Visit  Medication Sig Dispense Refill  . allopurinol (ZYLOPRIM) 100 MG tablet TAKE 1 TABLET BY MOUTH EVERY DAY (Patient taking differently: Take 100 mg by mouth daily. ) 90 tablet 0  . ibuprofen (ADVIL) 800 MG tablet TAKE 1 TABLET BY MOUTH EVERY 8 HOURS AS NEEDED (Patient taking differently: Take 800 mg by mouth every 8 (eight) hours as needed (pain.). ) 30 tablet 1  . Multiple Vitamin (MULTIVITAMIN WITH MINERALS) TABS tablet Take 1 tablet by mouth daily.    . Potassium 99 MG TABS Take 99 mg by mouth daily.    Marland Kitchen torsemide (DEMADEX) 20 MG tablet TAKE 1 TABLET (20 MG TOTAL) BY MOUTH 2 (TWO) TIMES DAILY AS NEEDED. (Patient taking  differently: Take 20-40 mg by mouth See admin instructions. Take 1 tablet (20 mg) by mouth scheduled in the morning, may take additional tablet in the morning (40 mg) if needed for fluid retention.) 180 tablet 1  . vitamin C (ASCORBIC ACID) 500 MG tablet Take 500 mg by mouth daily.    . metoprolol tartrate (LOPRESSOR) 100 MG tablet Take 1 tablet two hours prior to procedure. (Patient not taking: Reported on 05/24/2019) 1 tablet 0  . nitrofurantoin, macrocrystal-monohydrate, (MACROBID) 100 MG capsule Take 1 capsule (100 mg total) by mouth 2 (two) times daily. (Patient not taking: Reported on 04/28/2019) 14 capsule 0   No current facility-administered medications on file prior to visit.   No Known Allergies  Social History:   reports that she has never smoked. She has never used smokeless tobacco. She reports that she does not drink alcohol or use drugs.  Family History  Problem Relation Age of Onset  . Hypertension Mother   . Diabetes Mother   . Hypertension Father   . Heart disease Father   . Diabetes Sister   . Hypertension Sister   . Hyperlipidemia Sister   . Heart disease Sister   . Hypertension Brother   . Diabetes Brother   .  Colon cancer Neg Hx     Review of Systems: Noncontributory  PHYSICAL EXAM: Blood pressure 117/80, pulse 84, height 5\' 2"  (1.575 m), weight 247 lb (112 kg). General appearance - alert, well appearing, and in no distress Chest - clear to auscultation, no wheezes, rales or rhonchi, symmetric air entry Heart - normal rate and regular rhythm Abdomen - soft, nontender, nondistended, no masses or organomegaly                      Pelvic - examination deferred Extremities - peripheral pulses normal, no pedal edema, no clubbing or cyanosis  Labs: No results found for this or any previous visit (from the past 336 hour(s)).  Imaging Studies: IR Venipuncture 40Yrs/Older By Md  Result Date: 05/27/2019 INDICATION: 64 year old female presenting for cardiac CT  requires IV access EXAM: IMAGE GUIDED VENIPUNCTURE MEDICATIONS: None ANESTHESIA/SEDATION: None. FLUOROSCOPY TIME:  None COMPLICATIONS: None PROCEDURE: Informed written consent was obtained from the patient after a thorough discussion of the procedural risks, benefits and alternatives. All questions were addressed. Sterile Barrier Technique was utilized including caps, mask, sterile gloves, sterile drape, hand hygiene and skin antiseptic. A timeout was performed prior to the initiation of the procedure. Ultrasound survey of the upper extremity was performed with images stored and sent to PACs. A micropuncture needle was used access the right brachial vein under ultrasound. With venous blood flow returned, an .018 micro wire was passed through the needle into the vein. The needle was removed, and a micropuncture sheath was placed over the wire. The inner dilator and wire were removed, and the catheter was secured in position after attaching to saline flush. Patient tolerated the procedure well and remained hemodynamically stable throughout. No complications were encountered and no significant blood loss. IMPRESSION: Status post right brachial vein venipuncture with imaging guidance. Signed, Dulcy Fanny. Dellia Nims, RPVI [facility] Electronically Signed   By: Corrie Mckusick D.O.   On: 05/27/2019 11:47   IR US Guide Vasc Access Right  Result Date: 05/27/2019 INDICATION: 64 year old female presenting for cardiac CT requires IV access EXAM: IMAGE GUIDED VENIPUNCTURE MEDICATIONS: None ANESTHESIA/SEDATION: None. FLUOROSCOPY TIME:  None COMPLICATIONS: None PROCEDURE: Informed written consent was obtained from the patient after a thorough discussion of the procedural risks, benefits and alternatives. All questions were addressed. Sterile Barrier Technique was utilized including caps, mask, sterile gloves, sterile drape, hand hygiene and skin antiseptic. A timeout was  performed prior to the initiation of the procedure. Ultrasound survey of the upper extremity was performed with images stored and sent to PACs. A micropuncture needle was used access the right brachial vein under ultrasound. With venous blood flow returned, an .018 micro wire was passed through the needle into the vein. The needle was removed, and a micropuncture sheath was placed over the wire. The inner dilator and wire were removed, and the catheter was secured in position after attaching to saline flush. Patient tolerated the procedure well and remained hemodynamically stable throughout. No complications were encountered and no significant blood loss. IMPRESSION: Status post right brachial vein venipuncture with imaging guidance. Signed, Dulcy Fanny. Dellia Nims, RPVI [facility] Electronically Signed   By: Corrie Mckusick D.O.   On: 05/27/2019 11:47   CT CORONARY MORPH W/CTA COR W/SCORE W/CA W/CM &/OR WO/CM  Addendum Date: 05/27/2019   ADDENDUM REPORT: 05/27/2019 23:26 CLINICAL DATA:  64 year old female with h/o hypertension, obesity and atypical chest pain. EXAM:  Cardiac/Coronary  CTA TECHNIQUE: The patient was scanned on a [employer]. FINDINGS: A 100 kV prospective scan was triggered in the descending thoracic aorta at 111 HU's. Axial non-contrast 3 mm slices were carried out through the heart. The data set was analyzed on a dedicated work station and scored using the Angus. Gantry rotation speed was 250 msecs and collimation was .6 mm. 100 mg of PO Metoprolol and 0.8 mg of sl NTG was given. The 3D data set was reconstructed in 5% intervals of the 67-82 % of the R-R cycle. Diastolic phases were analyzed on a dedicated work station using MPR, MIP and VRT modes. The patient received 80 cc of contrast. Aorta:  Normal size.  No calcifications.  No dissection. Aortic Valve:  Trileaflet.  No calcifications. Coronary Arteries:  Normal  coronary origin.  Right dominance. RCA is a large dominant artery that gives rise to PDA and PLA. There is no plaque. Left main is a large artery that gives rise to LAD and LCX arteries. Left main has no plaque. LAD is a large vessel that gives rise to two diagonal arteries and has minimal calcified plaque in the proximal portion with stenosis 0-24%. LCX is a non-dominant artery that gives rise to one large OM1 branch. There is no plaque. Other findings: Normal pulmonary vein drainage into the left atrium. Normal left atrial appendage without a thrombus. Normal size of the pulmonary artery. IMPRESSION: 1. Coronary calcium score of 3. This was 33 percentile for age and sex matched control. 2. Normal coronary origin with right dominance. 3. CAD-RADS 1. Minimal non-obstructive CAD (0-24%). Consider non-atherosclerotic causes of chest pain. Consider preventive therapy and risk factor modification. Electronically Signed   By: Ena Dawley   On: 05/27/2019 23:26   Result Date: 05/27/2019 EXAM: OVER-READ INTERPRETATION  CT CHEST The following report is an over-read performed by radiologist Dr. Kerby Moors of [facility], [place] on 05/27/2019. This over-read does not include interpretation of cardiac or coronary anatomy or pathology. The coronary calcium score/coronary CTA interpretation by the cardiologist is attached. COMPARISON:  None. FINDINGS: Vascular: Normal heart size. No pericardial effusion identified. Mediastinum/Nodes: No mass or adenopathy. Lungs/Pleura: No pleural effusion. Upper Abdomen: No acute abnormality. Musculoskeletal: No chest wall mass or suspicious bone lesions identified. IMPRESSION: Negative over-read. Electronically Signed: By: Kerby Moors M.D. On: 05/27/2019 11:26    Assessment: Patient Active Problem List   Diagnosis Date Noted  . Gait instability 04/12/2019  . Borderline diabetes 08/05/2018  . Gout 08/05/2018  . Mild anxiety 03/06/2018  . Heme positive stool 12/22/2017   . Dizziness 09/01/2017  . Constipation 04/29/2017  . Peripheral edema 10/09/2015  . Hemorrhoids 10/28/2014  . Routine general medical examination at a health care facility 03/19/2013  . 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 Hot flashes 10/15/2012  . Class 3 obesity 01/14/2012  . Insomnia 01/14/2012  . OA (osteoarthritis) 01/14/2012  . Essential hypertension 08/13/2007    Plan: Patient will undergo surgical management with hysteroscopy, dilation and curettage.Jonnie Kind, MD  06/23/2019 11:31 AM   By signing my name below, I, Jacqualyn Posey, attest that this documentation has been prepared under the direction and in the presence of Jonnie Kind, MD. Electronically Signed: [place]. 06/23/19. 11:32 AM.  I personally performed the services described in this documentation, which was SCRIBED in my presence. The recorded information has been reviewed and considered accurate. It has been edited as necessary during review.  Jonnie Kind, MD

## 2019-06-25 ENCOUNTER — Encounter (HOSPITAL_COMMUNITY): Payer: Self-pay

## 2019-06-25 ENCOUNTER — Other Ambulatory Visit (HOSPITAL_COMMUNITY): Payer: BC Managed Care – PPO

## 2019-06-25 ENCOUNTER — Other Ambulatory Visit: Payer: Self-pay

## 2019-06-25 ENCOUNTER — Encounter (HOSPITAL_COMMUNITY)
Admission: RE | Admit: 2019-06-25 | Discharge: 2019-06-25 | Disposition: A | Discharge status: Home / Self Care | Payer: BC Managed Care – PPO | Source: Ambulatory Visit | Attending: Obstetrics and Gynecology | Admitting: Obstetrics and Gynecology

## 2019-06-25 ENCOUNTER — Other Ambulatory Visit: Payer: Self-pay | Admitting: Obstetrics and Gynecology

## 2019-06-25 DIAGNOSIS — Z01812 Encounter for preprocedural laboratory examination: Secondary | ICD-10-CM | POA: Insufficient documentation

## 2019-06-25 HISTORY — DX: Prediabetes: R73.03

## 2019-06-25 LAB — URINALYSIS, ROUTINE W REFLEX MICROSCOPIC
Bilirubin Urine: NEGATIVE
Glucose, UA: NEGATIVE mg/dL
Ketones, ur: NEGATIVE mg/dL
Leukocytes,Ua: NEGATIVE
Nitrite: NEGATIVE
Protein, ur: NEGATIVE mg/dL
Specific Gravity, Urine: 1.002 — ABNORMAL LOW (ref 1.005–1.030)
pH: 6 (ref 5.0–8.0)

## 2019-06-25 LAB — COMPREHENSIVE METABOLIC PANEL
ALT: 18 U/L (ref 0–44)
AST: 21 U/L (ref 15–41)
Albumin: 3.8 g/dL (ref 3.5–5.0)
Alkaline Phosphatase: 83 U/L (ref 38–126)
Anion gap: 8 (ref 5–15)
BUN: 23 mg/dL (ref 8–23)
CO2: 25 mmol/L (ref 22–32)
Calcium: 9.1 mg/dL (ref 8.9–10.3)
Chloride: 101 mmol/L (ref 98–111)
Creatinine, Ser: 0.81 mg/dL (ref 0.44–1.00)
GFR calc Af Amer: >60 mL/min (ref >60)
GFR calc non Af Amer: >60 mL/min (ref >60)
Glucose, Bld: 75 mg/dL (ref 70–99)
Potassium: 3.7 mmol/L (ref 3.5–5.1)
Sodium: 134 mmol/L — ABNORMAL LOW (ref 135–145)
Total Bilirubin: 1 mg/dL (ref 0.3–1.2)
Total Protein: 7.8 g/dL (ref 6.5–8.1)

## 2019-06-25 LAB — CBC
HCT: 39.4 % (ref 36.0–46.0)
Hemoglobin: 13.3 g/dL (ref 12.0–15.0)
MCH: 29 pg (ref 26.0–34.0)
MCHC: 33.8 g/dL (ref 30.0–36.0)
MCV: 86 fL (ref 80.0–100.0)
Platelets: 259 10*3/uL (ref 150–400)
RBC: 4.58 MIL/uL (ref 3.87–5.11)
RDW: 14.3 % (ref 11.5–15.5)
WBC: 5.4 10*3/uL (ref 4.0–10.5)
nRBC: 0 % (ref 0.0–0.2)

## 2019-06-25 NOTE — Patient Instructions (Signed)
Andrea Eaton  06/25/2019     @PREFPERIOPPHARMACY @   Your procedure is scheduled on Tuesday, may 11.  Report to [facility] at [place].M.  Call this number if you have problems the morning of surgery:  [phone]   Remember:  Do not eat or drink after midnight.   Take these medicines the morning of surgery with A SIP OF WATER none    Do not wear jewelry, make-up or nail polish.  Do not wear lotions, powders, or perfumes, or deodorant.  Do not shave 48 hours prior to surgery.  Men may shave face and neck.  Do not bring valuables to the hospital.  [facility] is not responsible for any belongings or valuables.  Contacts, dentures or bridgework may not be worn into surgery.  Leave your suitcase in the car.  After surgery it may be brought to your room.  For patients admitted to the hospital, discharge time will be determined by your treatment team.  Patients discharged the day of surgery will not be allowed to drive home.   Name and phone number of your driver:   family Special instructions:  None   Please read over the following fact sheets that you were given. Anesthesia Post-op Instructions and Care and Recovery After Surgery      Dilation and Curettage or Vacuum Curettage, Care After These instructions give you information about caring for yourself after your procedure. Your doctor may also give you more specific instructions. Call your doctor if you have any problems or questions after your procedure. Follow these instructions at home: Activity  Do not drive or use heavy machinery while taking prescription pain medicine.  For 24 hours after your procedure, avoid driving.  Take short walks often, followed by rest periods. Ask your doctor what activities are safe for you. After one or two days, you may be able to return to your normal activities.  Do not lift anything that is heavier than 10 lb (4.5 kg) until your doctor approves.  For at least 2 weeks, or as long  as told by your doctor: ? Do not douche. ? Do not use tampons. ? Do not have sex. General instructions   Take over-the-counter and prescription medicines only as told by your doctor. This is very important if you take blood thinning medicine.  Do not take baths, swim, or use a hot tub until your doctor approves. Take showers instead of baths.  Wear compression stockings as told by your doctor.  It is up to you to get the results of your procedure. Ask your doctor when your results will be ready.  Keep all follow-up visits as told by your doctor. This is important. Contact a doctor if:  You have very bad cramps that get worse or do not get better with medicine.  You have very bad pain in your belly (abdomen).  You cannot drink fluids without throwing up (vomiting).  You get pain in a different part of the area between your belly and thighs (pelvis).  You have bad-smelling discharge from your vagina.  You have a rash. Get help right away if:  You are bleeding a lot from your vagina. A lot of bleeding means soaking more than one sanitary pad in an hour, for 2 hours in a row.  You have clumps of blood (blood clots) coming from your vagina.  You have a fever or chills.  Your belly feels very tender or hard.  You have chest pain.  You have trouble breathing.  You cough up blood.  You feel dizzy.  You feel light-headed.  You pass out (faint).  You have pain in your neck or shoulder area. Summary  Take short walks often, followed by rest periods. Ask your doctor what activities are safe for you. After one or two days, you may be able to return to your normal activities.  Do not lift anything that is heavier than 10 lb (4.5 kg) until your doctor approves.  Do not take baths, swim, or use a hot tub until your doctor approves. Take showers instead of baths.  Contact your doctor if you have any symptoms of infection, like bad-smelling discharge from your vagina. This  information is not intended to replace advice given to you by your health care provider. Make sure you discuss any questions you have with your health care provider. Document Revised: 01/17/2017 Document Reviewed: 10/23/2015 Elsevier Patient Education  2020 [place] Anesthesia, Adult, Care After This sheet gives you information about how to care for yourself after your procedure. Your health care provider may also give you more specific instructions. If you have problems or questions, contact your health care provider. What can I expect after the procedure? After the procedure, the following side effects are common:  Pain or discomfort at the IV site.  Nausea.  Vomiting.  Sore throat.  Trouble concentrating.  Feeling cold or chills.  Weak or tired.  Sleepiness and fatigue.  Soreness and body aches. These side effects can affect parts of the body that were not involved in surgery. Follow these instructions at home:  For at least 24 hours after the procedure:  Have a responsible adult stay with you. It is important to have someone help care for you until you are awake and alert.  Rest as needed.  Do not: ? Participate in activities in which you could fall or become injured. ? Drive. ? Use heavy machinery. ? Drink alcohol. ? Take sleeping pills or medicines that cause drowsiness. ? Make important decisions or sign legal documents. ? Take care of children on your own. Eating and drinking  Follow any instructions from your health care provider about eating or drinking restrictions.  When you feel hungry, start by eating small amounts of foods that are soft and easy to digest (bland), such as toast. Gradually return to your regular diet.  Drink enough fluid to keep your urine pale yellow.  If you vomit, rehydrate by drinking water, juice, or clear broth. General instructions  If you have sleep apnea, surgery and certain medicines can increase your risk  for breathing problems. Follow instructions from your health care provider about wearing your sleep device: ? Anytime you are sleeping, including during daytime naps. ? While taking prescription pain medicines, sleeping medicines, or medicines that make you drowsy.  Return to your normal activities as told by your health care provider. Ask your health care provider what activities are safe for you.  Take over-the-counter and prescription medicines only as told by your health care provider.  If you smoke, do not smoke without supervision.  Keep all follow-up visits as told by your health care provider. This is important. Contact a health care provider if:  You have nausea or vomiting that does not get better with medicine.  You cannot eat or drink without vomiting.  You have pain that does not get better with medicine.  You are unable to pass urine.  You develop a skin rash.  You have a fever.  You have redness around your IV site that gets worse. Get help right away if:  You have difficulty breathing.  You have chest pain.  You have blood in your urine or stool, or you vomit blood. Summary  After the procedure, it is common to have a sore throat or nausea. It is also common to feel tired.  Have a responsible adult stay with you for the first 24 hours after general anesthesia. It is important to have someone help care for you until you are awake and alert.  When you feel hungry, start by eating small amounts of foods that are soft and easy to digest (bland), such as toast. Gradually return to your regular diet.  Drink enough fluid to keep your urine pale yellow.  Return to your normal activities as told by your health care provider. Ask your health care provider what activities are safe for you. This information is not intended to replace advice given to you by your health care provider. Make sure you discuss any questions you have with your health care provider. Document  Revised: 02/07/2017 Document Reviewed: 09/20/2016 Elsevier Patient Education  [place].   How to Use Chlorhexidine for Bathing Chlorhexidine gluconate (CHG) is a germ-killing (antiseptic) solution that is used to clean the skin. It can get rid of the bacteria that normally live on the skin and can keep them away for about 24 hours. To clean your skin with CHG, you may be given:  A CHG solution to use in the shower or as part of a sponge bath.  A prepackaged cloth that contains CHG. Cleaning your skin with CHG may help lower the risk for infection:  While you are staying in the intensive care unit of the hospital.  If you have a vascular access, such as a central line, to provide short-term or long-term access to your veins.  If you have a catheter to drain urine from your bladder.  If you are on a ventilator. A ventilator is a machine that helps you breathe by moving air in and out of your lungs.  After surgery. What are the risks? Risks of using CHG include:  A skin reaction.  Hearing loss, if CHG gets in your ears.  Eye injury, if CHG gets in your eyes and is not rinsed out.  The CHG product catching fire. Make sure that you avoid smoking and flames after applying CHG to your skin. Do not use CHG:  If you have a chlorhexidine allergy or have previously reacted to chlorhexidine.  On babies younger than 80 months of age. How to use CHG solution  Use CHG only as told by your health care provider, and follow the instructions on the label.  Use the full amount of CHG as directed. Usually, this is one bottle. During a shower Follow these steps when using CHG solution during a shower (unless your health care provider gives you different instructions): 1. Start the shower. 2. Use your normal soap and shampoo to wash your face and hair. 3. Turn off the shower or move out of the shower stream. 4. Pour the CHG onto a clean washcloth. Do not use any type of brush or  rough-edged sponge. 5. Starting at your neck, lather your body down to your toes. Make sure you follow these instructions: ? If you will be having surgery, pay special attention to the part of your body where you will be having surgery. Scrub this area for  at least 1 minute. ? Do not use CHG on your head or face. If the solution gets into your ears or eyes, rinse them well with water. ? Avoid your genital area. ? Avoid any areas of skin that have broken skin, cuts, or scrapes. ? Scrub your back and under your arms. Make sure to wash skin folds. 6. Let the lather sit on your skin for 1-2 minutes or as long as told by your health care provider. 7. Thoroughly rinse your entire body in the shower. Make sure that all body creases and crevices are rinsed well. 8. Dry off with a clean towel. Do not put any substances on your body afterward--such as powder, lotion, or perfume--unless you are told to do so by your health care provider. Only use lotions that are recommended by the manufacturer. 9. Put on clean clothes or pajamas. 10. If it is the night before your surgery, sleep in clean sheets.  During a sponge bath Follow these steps when using CHG solution during a sponge bath (unless your health care provider gives you different instructions): 1. Use your normal soap and shampoo to wash your face and hair. 2. Pour the CHG onto a clean washcloth. 3. Starting at your neck, lather your body down to your toes. Make sure you follow these instructions: ? If you will be having surgery, pay special attention to the part of your body where you will be having surgery. Scrub this area for at least 1 minute. ? Do not use CHG on your head or face. If the solution gets into your ears or eyes, rinse them well with water. ? Avoid your genital area. ? Avoid any areas of skin that have broken skin, cuts, or scrapes. ? Scrub your back and under your arms. Make sure to wash skin folds. 4. Let the lather sit on your  skin for 1-2 minutes or as long as told by your health care provider. 5. Using a different clean, wet washcloth, thoroughly rinse your entire body. Make sure that all body creases and crevices are rinsed well. 6. Dry off with a clean towel. Do not put any substances on your body afterward--such as powder, lotion, or perfume--unless you are told to do so by your health care provider. Only use lotions that are recommended by the manufacturer. 7. Put on clean clothes or pajamas. 8. If it is the night before your surgery, sleep in clean sheets. How to use CHG prepackaged cloths  Only use CHG cloths as told by your health care provider, and follow the instructions on the label.  Use the CHG cloth on clean, dry skin.  Do not use the CHG cloth on your head or face unless your health care provider tells you to.  When washing with the CHG cloth: ? Avoid your genital area. ? Avoid any areas of skin that have broken skin, cuts, or scrapes. Before surgery Follow these steps when using a CHG cloth to clean before surgery (unless your health care provider gives you different instructions): 1. Using the CHG cloth, vigorously scrub the part of your body where you will be having surgery. Scrub using a back-and-forth motion for 3 minutes. The area on your body should be completely wet with CHG when you are done scrubbing. 2. Do not rinse. Discard the cloth and let the area air-dry. Do not put any substances on the area afterward, such as powder, lotion, or perfume. 3. Put on clean clothes or pajamas. 4. If it is  the night before your surgery, sleep in clean sheets.  For general bathing Follow these steps when using CHG cloths for general bathing (unless your health care provider gives you different instructions). 1. Use a separate CHG cloth for each area of your body. Make sure you wash between any folds of skin and between your fingers and toes. Wash your body in the following order, switching to a new cloth  after each step: ? The front of your neck, shoulders, and chest. ? Both of your arms, under your arms, and your hands. ? Your stomach and groin area, avoiding the genitals. ? Your right leg and foot. ? Your left leg and foot. ? The back of your neck, your back, and your buttocks. 2. Do not rinse. Discard the cloth and let the area air-dry. Do not put any substances on your body afterward--such as powder, lotion, or perfume--unless you are told to do so by your health care provider. Only use lotions that are recommended by the manufacturer. 3. Put on clean clothes or pajamas. Contact a health care provider if:  Your skin gets irritated after scrubbing.  You have questions about using your solution or cloth. Get help right away if:  Your eyes become very red or swollen.  Your eyes itch badly.  Your skin itches badly and is red or swollen.  Your hearing changes.  You have trouble seeing.  You have swelling or tingling in your mouth or throat.  You have trouble breathing.  You swallow any chlorhexidine. Summary  Chlorhexidine gluconate (CHG) is a germ-killing (antiseptic) solution that is used to clean the skin. Cleaning your skin with CHG may help to lower your risk for infection.  You may be given CHG to use for bathing. It may be in a bottle or in a prepackaged cloth to use on your skin. Carefully follow your health care provider's instructions and the instructions on the product label.  Do not use CHG if you have a chlorhexidine allergy.  Contact your health care provider if your skin gets irritated after scrubbing. This information is not intended to replace advice given to you by your health care provider. Make sure you discuss any questions you have with your health care provider. Document Revised: 04/23/2018 Document Reviewed: 01/02/2017 Elsevier Patient Education  [place].

## 2019-06-28 ENCOUNTER — Other Ambulatory Visit: Payer: Self-pay

## 2019-06-28 ENCOUNTER — Other Ambulatory Visit (HOSPITAL_COMMUNITY)
Admission: RE | Admit: 2019-06-28 | Discharge: 2019-06-28 | Disposition: A | Discharge status: Home / Self Care | Payer: BC Managed Care – PPO | Source: Ambulatory Visit | Attending: Obstetrics and Gynecology | Admitting: Obstetrics and Gynecology

## 2019-06-28 DIAGNOSIS — Z01812 Encounter for preprocedural laboratory examination: Secondary | ICD-10-CM | POA: Insufficient documentation

## 2019-06-28 DIAGNOSIS — Z20822 Contact with and (suspected) exposure to covid-19: Secondary | ICD-10-CM | POA: Insufficient documentation

## 2019-06-28 LAB — SARS CORONAVIRUS 2 (TAT 6-24 HRS): SARS Coronavirus 2: NEGATIVE

## 2019-06-29 ENCOUNTER — Ambulatory Visit (HOSPITAL_COMMUNITY): Payer: BC Managed Care – PPO | Admitting: Anesthesiology

## 2019-06-29 ENCOUNTER — Other Ambulatory Visit: Payer: Self-pay

## 2019-06-29 ENCOUNTER — Encounter (HOSPITAL_COMMUNITY)
Admission: RE | Disposition: A | Discharge status: Home / Self Care | Payer: Self-pay | Source: Home / Self Care | Attending: Obstetrics and Gynecology

## 2019-06-29 ENCOUNTER — Ambulatory Visit (HOSPITAL_COMMUNITY)
Admission: RE | Admit: 2019-06-29 | Discharge: 2019-06-29 | Disposition: A | Discharge status: Home / Self Care | Payer: BC Managed Care – PPO | Attending: Obstetrics and Gynecology | Admitting: Obstetrics and Gynecology

## 2019-06-29 ENCOUNTER — Encounter (HOSPITAL_COMMUNITY): Payer: Self-pay | Admitting: Obstetrics and Gynecology

## 2019-06-29 DIAGNOSIS — G709 Myoneural disorder, unspecified: Secondary | ICD-10-CM | POA: Insufficient documentation

## 2019-06-29 DIAGNOSIS — F419 Anxiety disorder, unspecified: Secondary | ICD-10-CM | POA: Diagnosis not present

## 2019-06-29 DIAGNOSIS — N8502 Endometrial intraepithelial neoplasia [EIN]: Secondary | ICD-10-CM | POA: Insufficient documentation

## 2019-06-29 DIAGNOSIS — K5900 Constipation, unspecified: Secondary | ICD-10-CM | POA: Diagnosis not present

## 2019-06-29 DIAGNOSIS — I10 Essential (primary) hypertension: Secondary | ICD-10-CM | POA: Diagnosis not present

## 2019-06-29 DIAGNOSIS — G629 Polyneuropathy, unspecified: Secondary | ICD-10-CM | POA: Insufficient documentation

## 2019-06-29 DIAGNOSIS — N840 Polyp of corpus uteri: Secondary | ICD-10-CM | POA: Diagnosis not present

## 2019-06-29 DIAGNOSIS — Z8249 Family history of ischemic heart disease and other diseases of the circulatory system: Secondary | ICD-10-CM | POA: Diagnosis not present

## 2019-06-29 DIAGNOSIS — Z8349 Family history of other endocrine, nutritional and metabolic diseases: Secondary | ICD-10-CM | POA: Diagnosis not present

## 2019-06-29 DIAGNOSIS — Z79899 Other long term (current) drug therapy: Secondary | ICD-10-CM | POA: Diagnosis not present

## 2019-06-29 DIAGNOSIS — M1990 Unspecified osteoarthritis, unspecified site: Secondary | ICD-10-CM | POA: Insufficient documentation

## 2019-06-29 DIAGNOSIS — Z833 Family history of diabetes mellitus: Secondary | ICD-10-CM | POA: Diagnosis not present

## 2019-06-29 DIAGNOSIS — M109 Gout, unspecified: Secondary | ICD-10-CM | POA: Insufficient documentation

## 2019-06-29 DIAGNOSIS — G4700 Insomnia, unspecified: Secondary | ICD-10-CM | POA: Insufficient documentation

## 2019-06-29 DIAGNOSIS — N950 Postmenopausal bleeding: Secondary | ICD-10-CM | POA: Diagnosis present

## 2019-06-29 DIAGNOSIS — R7303 Prediabetes: Secondary | ICD-10-CM | POA: Diagnosis not present

## 2019-06-29 DIAGNOSIS — Z86010 Personal history of colonic polyps: Secondary | ICD-10-CM | POA: Diagnosis not present

## 2019-06-29 DIAGNOSIS — Z791 Long term (current) use of non-steroidal anti-inflammatories (NSAID): Secondary | ICD-10-CM | POA: Insufficient documentation

## 2019-06-29 HISTORY — PX: HYSTEROSCOPY WITH D & C: SHX1775

## 2019-06-29 SURGERY — DILATATION AND CURETTAGE /HYSTEROSCOPY
Anesthesia: General | Site: Vagina

## 2019-06-29 MED ORDER — PROPOFOL 10 MG/ML IV BOLUS
INTRAVENOUS | Status: AC
  Filled 2019-06-29: qty 40

## 2019-06-29 MED ORDER — FENTANYL CITRATE (PF) 250 MCG/5ML IJ SOLN
INTRAMUSCULAR | Status: AC
  Filled 2019-06-29: qty 5

## 2019-06-29 MED ORDER — DEXAMETHASONE SODIUM PHOSPHATE 4 MG/ML IJ SOLN
INTRAMUSCULAR | Status: DC | PRN
  Administered 2019-06-29: 10 mg via INTRAVENOUS

## 2019-06-29 MED ORDER — LACTATED RINGERS IV SOLN: Freq: Once | INTRAVENOUS | Status: AC

## 2019-06-29 MED ORDER — ONDANSETRON HCL 4 MG/2ML IJ SOLN
INTRAMUSCULAR | Status: DC | PRN
  Administered 2019-06-29: 4 mg via INTRAVENOUS

## 2019-06-29 MED ORDER — LACTATED RINGERS IV SOLN: INTRAVENOUS | Status: DC | PRN

## 2019-06-29 MED ORDER — FENTANYL CITRATE (PF) 100 MCG/2ML IJ SOLN
INTRAMUSCULAR | Status: DC | PRN
  Administered 2019-06-29: 50 ug via INTRAVENOUS

## 2019-06-29 MED ORDER — LIDOCAINE 2% (20 MG/ML) 5 ML SYRINGE
INTRAMUSCULAR | Status: AC
  Filled 2019-06-29: qty 5

## 2019-06-29 MED ORDER — BUPIVACAINE-EPINEPHRINE (PF) 0.5% -1:200000 IJ SOLN
INTRAMUSCULAR | Status: DC | PRN
  Administered 2019-06-29: 10 mL via PERINEURAL

## 2019-06-29 MED ORDER — SEVOFLURANE IN SOLN
RESPIRATORY_TRACT | Status: AC
  Filled 2019-06-29: qty 250

## 2019-06-29 MED ORDER — LIDOCAINE HCL (CARDIAC) PF 100 MG/5ML IV SOSY
PREFILLED_SYRINGE | INTRAVENOUS | Status: DC | PRN
  Administered 2019-06-29: 60 mg via INTRAVENOUS

## 2019-06-29 MED ORDER — MEPERIDINE HCL 50 MG/ML IJ SOLN: 6.2500 mg | INTRAMUSCULAR | Status: DC | PRN

## 2019-06-29 MED ORDER — GLYCOPYRROLATE 0.2 MG/ML IJ SOLN
INTRAMUSCULAR | Status: DC | PRN
  Administered 2019-06-29: .2 mg via INTRAVENOUS

## 2019-06-29 MED ORDER — PROMETHAZINE HCL 25 MG/ML IJ SOLN: 6.2500 mg | INTRAMUSCULAR | Status: DC | PRN

## 2019-06-29 MED ORDER — PROPOFOL 10 MG/ML IV BOLUS
INTRAVENOUS | Status: DC | PRN
  Administered 2019-06-29: 150 mg via INTRAVENOUS

## 2019-06-29 MED ORDER — MIDAZOLAM HCL 2 MG/2ML IJ SOLN
INTRAMUSCULAR | Status: AC
  Filled 2019-06-29: qty 2

## 2019-06-29 MED ORDER — BUPIVACAINE-EPINEPHRINE (PF) 0.5% -1:200000 IJ SOLN
INTRAMUSCULAR | Status: AC
  Filled 2019-06-29: qty 30

## 2019-06-29 MED ORDER — DEXAMETHASONE SODIUM PHOSPHATE 10 MG/ML IJ SOLN
INTRAMUSCULAR | Status: AC
  Filled 2019-06-29: qty 2

## 2019-06-29 MED ORDER — MIDAZOLAM HCL 2 MG/2ML IJ SOLN
INTRAMUSCULAR | Status: DC | PRN
  Administered 2019-06-29: 1 mg via INTRAVENOUS

## 2019-06-29 MED ORDER — ONDANSETRON HCL 4 MG/2ML IJ SOLN
INTRAMUSCULAR | Status: AC
  Filled 2019-06-29: qty 2

## 2019-06-29 MED ORDER — SODIUM CHLORIDE 0.9 % IR SOLN
Status: DC | PRN
  Administered 2019-06-29: 3000 mL

## 2019-06-29 MED ORDER — HYDROMORPHONE HCL 1 MG/ML IJ SOLN: 0.2500 mg | INTRAMUSCULAR | Status: DC | PRN

## 2019-06-29 MED ORDER — CEFAZOLIN SODIUM-DEXTROSE 2-4 GM/100ML-% IV SOLN
2.0000 g | INTRAVENOUS | Status: AC
  Administered 2019-06-29: 2 g via INTRAVENOUS
  Filled 2019-06-29: qty 100

## 2019-06-29 MED ORDER — SUCCINYLCHOLINE CHLORIDE 200 MG/10ML IV SOSY
PREFILLED_SYRINGE | INTRAVENOUS | Status: AC
  Filled 2019-06-29: qty 10

## 2019-06-29 SURGICAL SUPPLY — 24 items
CLOTH BEACON ORANGE TIMEOUT ST (SAFETY) ×3 IMPLANT
COVER LIGHT HANDLE STERIS (MISCELLANEOUS) ×6 IMPLANT
COVER WAND RF STERILE (DRAPES) ×3 IMPLANT
DECANTER SPIKE VIAL GLASS SM (MISCELLANEOUS) ×3 IMPLANT
GAUZE 4X4 16PLY RFD (DISPOSABLE) ×3 IMPLANT
GLOVE BIOGEL PI IND STRL 7.0 (GLOVE) ×2 IMPLANT
GLOVE BIOGEL PI IND STRL 9 (GLOVE) ×1 IMPLANT
GLOVE BIOGEL PI INDICATOR 7.0 (GLOVE) ×4
GLOVE BIOGEL PI INDICATOR 9 (GLOVE) ×2
GLOVE ECLIPSE 6.5 STRL STRAW (GLOVE) ×2 IMPLANT
GLOVE ECLIPSE 9.0 STRL (GLOVE) ×3 IMPLANT
GOWN SPEC L3 XXLG W/TWL (GOWN DISPOSABLE) ×3 IMPLANT
GOWN STRL REUS W/TWL LRG LVL3 (GOWN DISPOSABLE) ×3 IMPLANT
INST SET HYSTEROSCOPY (KITS) ×3 IMPLANT
IV NS IRRIG 3000ML ARTHROMATIC (IV SOLUTION) ×3 IMPLANT
KIT TURNOVER KIT A (KITS) ×3 IMPLANT
MANIFOLD NEPTUNE II (INSTRUMENTS) ×3 IMPLANT
NS IRRIG 1000ML POUR BTL (IV SOLUTION) ×3 IMPLANT
PACK PERI GYN (CUSTOM PROCEDURE TRAY) ×3 IMPLANT
PAD ARMBOARD 7.5X6 YLW CONV (MISCELLANEOUS) ×3 IMPLANT
PAD TELFA 3X4 1S STER (GAUZE/BANDAGES/DRESSINGS) ×3 IMPLANT
SET BASIN LINEN APH (SET/KITS/TRAYS/PACK) ×3 IMPLANT
SET CYSTO W/LG BORE CLAMP LF (SET/KITS/TRAYS/PACK) ×3 IMPLANT
SYR CONTROL 10ML LL (SYRINGE) ×3 IMPLANT

## 2019-06-29 NOTE — H&P (Signed)
Preoperative History and Physical  Andrea Eaton is a 64 y.o. [id number] here for surgical management of post menopausal bleeding.   No significant preoperative concerns. Endometrial biopsy is benign, but there is extensive polypoid tissue, so a more thorough diagnosis is desired.  Proposed surgery: Dilatation and curettage/ Hysteroscopy  Past Medical History:  Diagnosis Date  . Arthritis    Dr. Sharol Given  . Hypertension   . Prediabetes    Past Surgical History:  Procedure Laterality Date  . COLONOSCOPY  07/2007   Dr. Fuller Plan: diverticulosis, internal hemorrhoids  . COLONOSCOPY N/A 03/11/2018   Procedure: COLONOSCOPY;  Surgeon: Danie Binder, MD;  Location: [facility];  Service: Endoscopy;  Laterality: N/A;  9:30am  . ESOPHAGOGASTRODUODENOSCOPY  07/2007   Dr. Fuller Plan: H.pylori gastritis  . IR US GUIDE VASC ACCESS RIGHT  05/27/2019  . IR VENIPUNCTURE 43YRS/OLDER BY MD  05/27/2019  . POLYPECTOMY  03/11/2018   Procedure: POLYPECTOMY;  Surgeon: Danie Binder, MD;  Location: [facility];  Service: Endoscopy;;  descending colon   OB History  Gravida Para Term Preterm AB Living  3       1 3   SAB TAB Ectopic Multiple Live Births  1       2    # Outcome Date GA Lbr Len/2nd Weight Sex Delivery Anes PTL Lv  3 Gravida     M Vag-Spont   LIV  2 Gravida     F Vag-Spont   LIV  1 SAB         FD  Patient denies any other pertinent gynecologic issues.   No current facility-administered medications on file prior to encounter.   Current Outpatient Medications on File Prior to Encounter  Medication Sig Dispense Refill  . ibuprofen (ADVIL) 800 MG tablet TAKE 1 TABLET BY MOUTH EVERY 8 HOURS AS NEEDED (Patient taking differently: Take 800 mg by mouth every 8 (eight) hours as needed (pain.). ) 30 tablet 1  . Multiple Vitamin (MULTIVITAMIN WITH MINERALS) TABS tablet Take 1 tablet by mouth daily.    . Potassium 99 MG TABS Take 99 mg by mouth daily.    Marland Kitchen torsemide (DEMADEX) 20 MG tablet TAKE 1 TABLET (20 MG  TOTAL) BY MOUTH 2 (TWO) TIMES DAILY AS NEEDED. (Patient taking differently: Take 20-40 mg by mouth See admin instructions. Take 1 tablet (20 mg) by mouth scheduled in the morning, may take additional tablet in the morning (40 mg) if needed for fluid retention.) 180 tablet 1  . vitamin C (ASCORBIC ACID) 500 MG tablet Take 500 mg by mouth daily.    . metoprolol tartrate (LOPRESSOR) 100 MG tablet Take 1 tablet two hours prior to procedure. (Patient not taking: Reported on 05/24/2019) 1 tablet 0  . nitrofurantoin, macrocrystal-monohydrate, (MACROBID) 100 MG capsule Take 1 capsule (100 mg total) by mouth 2 (two) times daily. (Patient not taking: Reported on 04/28/2019) 14 capsule 0   No Known Allergies  Social History:   reports that she has never smoked. She has never used smokeless tobacco. She reports that she does not drink alcohol or use drugs.  Family History  Problem Relation Age of Onset  . Hypertension Mother   . Diabetes Mother   . Hypertension Father   . Heart disease Father   . Diabetes Sister   . Hypertension Sister   . Hyperlipidemia Sister   . Heart disease Sister   . Hypertension Brother   . Diabetes Brother   . Colon cancer Neg Hx  Review of Systems: Noncontributory  PHYSICAL EXAM: There were no vitals taken for this visit. General appearance - alert, well appearing, and in no distress Chest - clear to auscultation, no wheezes, rales or rhonchi, symmetric air entry Heart - normal rate and regular rhythm Abdomen - soft, nontender, nondistended, no masses or organomegaly                      Pelvic - examination deferred Extremities - peripheral pulses normal, no pedal edema, no clubbing or cyanosis  Labs: Results for orders placed or performed during the hospital encounter of 06/28/19 (from the past 336 hour(s))  SARS CORONAVIRUS 2 (TAT 6-24 HRS) Nasopharyngeal Nasopharyngeal Swab   Collection Time: 06/28/19  8:02 AM   Specimen: Nasopharyngeal Swab  Result Value  Ref Range   SARS Coronavirus 2 NEGATIVE NEGATIVE  Results for orders placed or performed during the hospital encounter of 06/25/19 (from the past 336 hour(s))  CBC   Collection Time: 06/25/19  4:23 PM  Result Value Ref Range   WBC 5.4 4.0 - 10.5 K/uL   RBC 4.58 3.87 - 5.11 MIL/uL   Hemoglobin 13.3 12.0 - 15.0 g/dL   HCT 39.4 36.0 - 46.0 %   MCV 86.0 80.0 - 100.0 fL   MCH 29.0 26.0 - 34.0 pg   MCHC 33.8 30.0 - 36.0 g/dL   RDW 14.3 11.5 - 15.5 %   Platelets 259 150 - 400 K/uL   nRBC 0.0 0.0 - 0.2 %  Comprehensive metabolic panel   Collection Time: 06/25/19  4:23 PM  Result Value Ref Range   Sodium 134 (L) 135 - 145 mmol/L   Potassium 3.7 3.5 - 5.1 mmol/L   Chloride 101 98 - 111 mmol/L   CO2 25 22 - 32 mmol/L   Glucose, Bld 75 70 - 99 mg/dL   BUN 23 8 - 23 mg/dL   Creatinine, Ser 0.81 0.44 - 1.00 mg/dL   Calcium 9.1 8.9 - 10.3 mg/dL   Total Protein 7.8 6.5 - 8.1 g/dL   Albumin 3.8 3.5 - 5.0 g/dL   AST 21 15 - 41 U/L   ALT 18 0 - 44 U/L   Alkaline Phosphatase 83 38 - 126 U/L   Total Bilirubin 1.0 0.3 - 1.2 mg/dL   GFR calc non Af Amer >60 >60 mL/min   GFR calc Af Amer >60 >60 mL/min   Anion gap 8 5 - 15  Urinalysis, Routine w reflex microscopic   Collection Time: 06/25/19  4:23 PM  Result Value Ref Range   Color, Urine STRAW (A) YELLOW   APPearance CLEAR CLEAR   Specific Gravity, Urine 1.002 (L) 1.005 - 1.030   pH 6.0 5.0 - 8.0   Glucose, UA NEGATIVE NEGATIVE mg/dL   Hgb urine dipstick LARGE (A) NEGATIVE   Bilirubin Urine NEGATIVE NEGATIVE   Ketones, ur NEGATIVE NEGATIVE mg/dL   Protein, ur NEGATIVE NEGATIVE mg/dL   Nitrite NEGATIVE NEGATIVE   Leukocytes,Ua NEGATIVE NEGATIVE   RBC / HPF 0-5 0 - 5 RBC/hpf   WBC, UA 0-5 0 - 5 WBC/hpf   Bacteria, UA RARE (A) NONE SEEN   Squamous Epithelial / LPF 0-5 0 - 5    Imaging Studies:   Assessment: Patient Active Problem List   Diagnosis Date Noted  . Postmenopausal bleeding 06/23/2019  . Thickened endometrium 06/23/2019   . Gait instability 04/12/2019  . Borderline diabetes 08/05/2018  . Gout 08/05/2018  . Mild anxiety 03/06/2018  .  Heme positive stool 12/22/2017  . Dizziness 09/01/2017  . Constipation 04/29/2017  . Peripheral edema 10/09/2015  . Hemorrhoids 10/28/2014  . Routine general medical examination at a health care facility 03/19/2013  . 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 Hot flashes 10/15/2012  . Class 3 obesity 01/14/2012  . Insomnia 01/14/2012  . OA (osteoarthritis) 01/14/2012  . Essential hypertension 08/13/2007    Plan: Patient will undergo surgical management with hysteroscopy, dilation and curettage.Jonnie Kind, MD  06/29/2019 7:15 AM

## 2019-06-29 NOTE — Interval H&P Note (Signed)
History and Physical Interval Note:  06/29/2019 10:05 AM  Andrea Eaton  has presented today for surgery, with the diagnosis of Postmenopausal Bleeding.  The various methods of treatment have been discussed with the patient and family. After consideration of risks, benefits and other options for treatment, the patient has consented to  Procedure(s): DILATATION AND CURETTAGE /HYSTEROSCOPY (N/A) as a surgical intervention.  The patient's history has been reviewed, patient examined, no change in status, stable for surgery.  I have reviewed the patient's chart and labs.  Questions were answered to the patient's satisfaction.     Jonnie Kind

## 2019-06-29 NOTE — Op Note (Signed)
06/29/2019  11:11 AM  PATIENT:  Andrea Eaton  64 y.o. female  PRE-OPERATIVE DIAGNOSIS:  Postmenopausal Bleeding  POST-OPERATIVE DIAGNOSIS:  Postmenopausal Bleeding, endometrial polyp  PROCEDURE:  Procedure(s): DILATATION AND CURETTAGE /HYSTEROSCOPY, removal of endometrial polyp (N/A)  SURGEON:  Surgeon(s) and Role:    Jonnie Kind, MD - Primary  PHYSICIAN ASSISTANT:   ASSISTANTS: Zoila Shutter, CST  ANESTHESIA:   general and paracervical block  EBL:  10 mL   BLOOD ADMINISTERED:none  DRAINS: none   LOCAL MEDICATIONS USED:  MARCAINE    and Amount: 10 ml  SPECIMEN:  Source of Specimen:  Polyp endometrial curettings  DISPOSITION OF SPECIMEN:  PATHOLOGY  COUNTS:  YES  TOURNIQUET:  * No tourniquets in log *  DICTATION: .Dragon Dictation  PLAN OF CARE: Discharge to home after PACU  PATIENT DISPOSITION:  PACU - hemodynamically stable.   Delay start of Pharmacological VTE agent (>24hrs) due to surgical blood loss or risk of bleeding: not applicable Details of procedure: Patient was taken the operating room prepped and draped for vaginal procedure.  Timeout was conducted Ancef was administered.  Speculum was inserted, cervix visualized, dilated easily to 23 [place] and sounded to 12 cm.  Hysteroscope was introduced and the endometrial cavity was seen to have an elongate polyp and normal tubal ostia.  There was no generalized hyperplasia.  The polyp was grasped with Randall stone forceps and extracted and fragments, repeat hysteroscopy revealed that there was minimal tissue remnant.  Operative hysteroscopic biopsy scissors were used to amputate off the stump remnant originating from the fundal portion on the right side, and then repeat hysteroscopy confirmed satisfactory evacuation of the fragments.  Smooth sharp curettage again confirmed no further tissue fragment and the patient was allowed awaken go to recovery room in good condition

## 2019-06-29 NOTE — Brief Op Note (Signed)
06/29/2019  11:11 AM  PATIENT:  Andrea Eaton  64 y.o. female  PRE-OPERATIVE DIAGNOSIS:  Postmenopausal Bleeding  POST-OPERATIVE DIAGNOSIS:  Postmenopausal Bleeding, endometrial polyp  PROCEDURE:  Procedure(s): DILATATION AND CURETTAGE /HYSTEROSCOPY, removal of endometrial polyp (N/A)  SURGEON:  Surgeon(s) and Role:    Jonnie Kind, MD - Primary  PHYSICIAN ASSISTANT:   ASSISTANTS: Zoila Shutter, CST  ANESTHESIA:   general and paracervical block  EBL:  10 mL   BLOOD ADMINISTERED:none  DRAINS: none   LOCAL MEDICATIONS USED:  MARCAINE    and Amount: 10 ml  SPECIMEN:  Source of Specimen:  Polyp endometrial curettings  DISPOSITION OF SPECIMEN:  PATHOLOGY  COUNTS:  YES  TOURNIQUET:  * No tourniquets in log *  DICTATION: .Dragon Dictation  PLAN OF CARE: Discharge to home after PACU  PATIENT DISPOSITION:  PACU - hemodynamically stable.   Delay start of Pharmacological VTE agent (>24hrs) due to surgical blood loss or risk of bleeding: not applicable Details of procedure: Patient was taken the operating room prepped and draped for vaginal procedure.  Timeout was conducted Ancef was administered.  Speculum was inserted, cervix visualized, dilated easily to 23 [place] and sounded to 12 cm.  Hysteroscope was introduced and the endometrial cavity was seen to have an elongate polyp and normal tubal ostia.  There was no generalized hyperplasia.  The polyp was grasped with Randall stone forceps and extracted and fragments, repeat hysteroscopy revealed that there was minimal tissue remnant.  Operative hysteroscopic biopsy scissors were used to amputate off the stump remnant originating from the fundal portion on the right side, and then repeat hysteroscopy confirmed satisfactory evacuation of the fragments.  Smooth sharp curettage again confirmed no further tissue fragment and the patient was allowed awaken go to recovery room in good condition

## 2019-06-29 NOTE — Discharge Instructions (Signed)
Dr. Glo Herring will call you with tissue report results in a couple of days.  If you have not heard from him by next Friday, please call the office at [phone]    Hysteroscopy Hysteroscopy is a procedure that is used to examine the inside of a woman's womb (uterus). This may be done for various reasons, including:  To look for lumps (tumors) and other growths in the uterus.  To evaluate abnormal bleeding, fibroid tumors, polyps, scar tissue (adhesions), or cancer of the uterus.  To determine the cause of an inability to get pregnant (infertility) or repeated losses of pregnancies (miscarriages).  To find a lost IUD (intrauterine device).  To perform a procedure that permanently prevents pregnancy (sterilization). During this procedure, a thin, flexible tube with a small light and camera (hysteroscope) is used to examine the uterus. The camera sends images to a monitor in the room so that your health care provider can view the inside of your uterus. A hysteroscopy should be done right after a menstrual period to make sure that you are not pregnant. Tell a health care provider about:  Any allergies you have.  All medicines you are taking, including vitamins, herbs, eye drops, creams, and over-the-counter medicines.  Any problems you or family members have had with the use of anesthetic medicines.  Any blood disorders you have.  Any surgeries you have had.  Any medical conditions you have.  Whether you are pregnant or may be pregnant. What are the risks? Generally, this is a safe procedure. However, problems may occur, including:  Excessive bleeding.  Infection.  Damage to the uterus or other structures or organs.  Allergic reaction to medicines or fluids that are used in the procedure. What happens before the procedure? Staying hydrated Follow instructions from your health care provider about hydration, which may include:  Up to 2 hours before the procedure - you may  continue to drink clear liquids, such as water, clear fruit juice, black coffee, and plain tea. Eating and drinking restrictions Follow instructions from your health care provider about eating and drinking, which may include:  8 hours before the procedure - stop eating solid foods and drink clear liquids only  2 hours before the procedure - stop drinking clear liquids. General instructions  Ask your health care provider about: ? Changing or stopping your normal medicines. This is important if you take diabetes medicines or blood thinners. ? Taking medicines such as aspirin and ibuprofen. These medicines can thin your blood and cause bleeding. Do not take these medicines for 1 week before your procedure, or as told by your health care provider.  Do not use any products that contain nicotine or tobacco for 2 weeks before the procedure. This includes cigarettes and e-cigarettes. If you need help quitting, ask your health care provider.  Medicine may be placed in your cervix the day before the procedure. This medicine causes the cervix to have a larger opening (dilate). The larger opening makes it easier for the hysteroscope to be inserted into the uterus during the procedure.  Plan to have someone with you for the first 24-48 hours after the procedure, especially if you are given a medicine to make you fall asleep (general anesthetic).  Plan to have someone take you home from the hospital or clinic. What happens during the procedure?  To lower your risk of infection: ? Your health care team will wash or sanitize their hands. ? Your skin will be washed with soap. ? Hair may  be removed from the surgical area.  An IV tube will be inserted into one of your veins.  You may be given one or more of the following: ? A medicine to help you relax (sedative). ? A medicine that numbs the area around the cervix (local anesthetic). ? A medicine to make you fall asleep (general anesthetic).  A  hysteroscope will be inserted through your vagina and into your uterus.  Air or fluid will be used to enlarge your uterus, enabling your health care provider to see your uterus better. The amount of fluid used will be carefully checked throughout the procedure.  In some cases, tissue may be gently scraped from inside the uterus and sent to a lab for testing (biopsy). The procedure may vary among health care providers and hospitals. What happens after the procedure?  Your blood pressure, heart rate, breathing rate, and blood oxygen level will be monitored until the medicines you were given have worn off.  You may have some cramping. You may be given medicines for this.  You may have bleeding, which varies from light spotting to menstrual-like bleeding. This is normal.  If you had a biopsy done, it is your responsibility to get the results of your procedure. Ask your health care provider, or the department performing the procedure, when your results will be ready. Summary  Hysteroscopy is a procedure that is used to examine the inside of a woman's womb (uterus).  After the procedure, you may have bleeding, which varies from light spotting to menstrual-like bleeding. This is normal. You may also have cramping.  Plan to have someone take you home from the hospital or clinic. This information is not intended to replace advice given to you by your health care provider. Make sure you discuss any questions you have with your health care provider. Document Revised: 01/17/2017 Document Reviewed: 03/05/2016 Elsevier Patient Education  [place].

## 2019-06-29 NOTE — Progress Notes (Signed)
Preoperative History and Physical  Andrea Eaton is a 64 y.o. [id number] here for surgical management of post menopausal bleeding.   No significant preoperative concerns.  Proposed surgery: Dilatation and curettage/ Hysteroscopy  Past Medical History:  Diagnosis Date  . Arthritis    Dr. Sharol Given  . Hypertension   . Prediabetes    Past Surgical History:  Procedure Laterality Date  . COLONOSCOPY  07/2007   Dr. Fuller Plan: diverticulosis, internal hemorrhoids  . COLONOSCOPY N/A 03/11/2018   Procedure: COLONOSCOPY;  Surgeon: Danie Binder, MD;  Location: [facility];  Service: Endoscopy;  Laterality: N/A;  9:30am  . ESOPHAGOGASTRODUODENOSCOPY  07/2007   Dr. Fuller Plan: H.pylori gastritis  . IR US GUIDE VASC ACCESS RIGHT  05/27/2019  . IR VENIPUNCTURE 93YRS/OLDER BY MD  05/27/2019  . POLYPECTOMY  03/11/2018   Procedure: POLYPECTOMY;  Surgeon: Danie Binder, MD;  Location: [facility];  Service: Endoscopy;;  descending colon   OB History  Gravida Para Term Preterm AB Living  3       1 3   SAB TAB Ectopic Multiple Live Births  1       2    # Outcome Date GA Lbr Len/2nd Weight Sex Delivery Anes PTL Lv  3 Gravida     M Vag-Spont   LIV  2 Gravida     F Vag-Spont   LIV  1 SAB         FD  Patient denies any other pertinent gynecologic issues.   No current facility-administered medications on file prior to encounter.   Current Outpatient Medications on File Prior to Encounter  Medication Sig Dispense Refill  . ibuprofen (ADVIL) 800 MG tablet TAKE 1 TABLET BY MOUTH EVERY 8 HOURS AS NEEDED (Patient taking differently: Take 800 mg by mouth every 8 (eight) hours as needed (pain.). ) 30 tablet 1  . Multiple Vitamin (MULTIVITAMIN WITH MINERALS) TABS tablet Take 1 tablet by mouth daily.    . Potassium 99 MG TABS Take 99 mg by mouth daily.    Marland Kitchen torsemide (DEMADEX) 20 MG tablet TAKE 1 TABLET (20 MG TOTAL) BY MOUTH 2 (TWO) TIMES DAILY AS NEEDED. (Patient taking differently: Take 20-40 mg by mouth See admin  instructions. Take 1 tablet (20 mg) by mouth scheduled in the morning, may take additional tablet in the morning (40 mg) if needed for fluid retention.) 180 tablet 1  . vitamin C (ASCORBIC ACID) 500 MG tablet Take 500 mg by mouth daily.    . metoprolol tartrate (LOPRESSOR) 100 MG tablet Take 1 tablet two hours prior to procedure. (Patient not taking: Reported on 05/24/2019) 1 tablet 0  . nitrofurantoin, macrocrystal-monohydrate, (MACROBID) 100 MG capsule Take 1 capsule (100 mg total) by mouth 2 (two) times daily. (Patient not taking: Reported on 04/28/2019) 14 capsule 0   No Known Allergies  Social History:   reports that she has never smoked. She has never used smokeless tobacco. She reports that she does not drink alcohol or use drugs.  Family History  Problem Relation Age of Onset  . Hypertension Mother   . Diabetes Mother   . Hypertension Father   . Heart disease Father   . Diabetes Sister   . Hypertension Sister   . Hyperlipidemia Sister   . Heart disease Sister   . Hypertension Brother   . Diabetes Brother   . Colon cancer Neg Hx     Review of Systems: Noncontributory  PHYSICAL EXAM: There were no vitals taken for  this visit. General appearance - alert, well appearing, and in no distress Chest - clear to auscultation, no wheezes, rales or rhonchi, symmetric air entry Heart - normal rate and regular rhythm Abdomen - soft, nontender, nondistended, no masses or organomegaly                      Pelvic - examination deferred Extremities - peripheral pulses normal, no pedal edema, no clubbing or cyanosis  Labs: Results for orders placed or performed during the hospital encounter of 06/28/19 (from the past 336 hour(s))  SARS CORONAVIRUS 2 (TAT 6-24 HRS) Nasopharyngeal Nasopharyngeal Swab   Collection Time: 06/28/19  8:02 AM   Specimen: Nasopharyngeal Swab  Result Value Ref Range   SARS Coronavirus 2 NEGATIVE NEGATIVE  Results for orders placed or performed during the hospital  encounter of 06/25/19 (from the past 336 hour(s))  CBC   Collection Time: 06/25/19  4:23 PM  Result Value Ref Range   WBC 5.4 4.0 - 10.5 K/uL   RBC 4.58 3.87 - 5.11 MIL/uL   Hemoglobin 13.3 12.0 - 15.0 g/dL   HCT 39.4 36.0 - 46.0 %   MCV 86.0 80.0 - 100.0 fL   MCH 29.0 26.0 - 34.0 pg   MCHC 33.8 30.0 - 36.0 g/dL   RDW 14.3 11.5 - 15.5 %   Platelets 259 150 - 400 K/uL   nRBC 0.0 0.0 - 0.2 %  Comprehensive metabolic panel   Collection Time: 06/25/19  4:23 PM  Result Value Ref Range   Sodium 134 (L) 135 - 145 mmol/L   Potassium 3.7 3.5 - 5.1 mmol/L   Chloride 101 98 - 111 mmol/L   CO2 25 22 - 32 mmol/L   Glucose, Bld 75 70 - 99 mg/dL   BUN 23 8 - 23 mg/dL   Creatinine, Ser 0.81 0.44 - 1.00 mg/dL   Calcium 9.1 8.9 - 10.3 mg/dL   Total Protein 7.8 6.5 - 8.1 g/dL   Albumin 3.8 3.5 - 5.0 g/dL   AST 21 15 - 41 U/L   ALT 18 0 - 44 U/L   Alkaline Phosphatase 83 38 - 126 U/L   Total Bilirubin 1.0 0.3 - 1.2 mg/dL   GFR calc non Af Amer >60 >60 mL/min   GFR calc Af Amer >60 >60 mL/min   Anion gap 8 5 - 15  Urinalysis, Routine w reflex microscopic   Collection Time: 06/25/19  4:23 PM  Result Value Ref Range   Color, Urine STRAW (A) YELLOW   APPearance CLEAR CLEAR   Specific Gravity, Urine 1.002 (L) 1.005 - 1.030   pH 6.0 5.0 - 8.0   Glucose, UA NEGATIVE NEGATIVE mg/dL   Hgb urine dipstick LARGE (A) NEGATIVE   Bilirubin Urine NEGATIVE NEGATIVE   Ketones, ur NEGATIVE NEGATIVE mg/dL   Protein, ur NEGATIVE NEGATIVE mg/dL   Nitrite NEGATIVE NEGATIVE   Leukocytes,Ua NEGATIVE NEGATIVE   RBC / HPF 0-5 0 - 5 RBC/hpf   WBC, UA 0-5 0 - 5 WBC/hpf   Bacteria, UA RARE (A) NONE SEEN   Squamous Epithelial / LPF 0-5 0 - 5    Imaging Studies: No results found.  Assessment: Patient Active Problem List   Diagnosis Date Noted  . Postmenopausal bleeding 06/23/2019  . Thickened endometrium 06/23/2019  . Gait instability 04/12/2019  . Borderline diabetes 08/05/2018  . Gout 08/05/2018  .  Mild anxiety 03/06/2018  . Heme positive stool 12/22/2017  . Dizziness 09/01/2017  . Constipation  04/29/2017  . Peripheral edema 10/09/2015  . Hemorrhoids 10/28/2014  . Routine general medical examination at a health care facility 03/19/2013  . 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 Hot flashes 10/15/2012  . Class 3 obesity 01/14/2012  . Insomnia 01/14/2012  . OA (osteoarthritis) 01/14/2012  . Essential hypertension 08/13/2007    Plan: Patient will undergo surgical management with hysteroscopy, dilation and curettage.Jonnie Kind, MD  06/29/2019 7:14 AM   By signing my name below, I, Jacqualyn Posey, attest that this documentation has been prepared under the direction and in the presence of Jonnie Kind, MD. Electronically Signed: [place]. 06/29/19. 7:14 AM.  I personally performed the services described in this documentation, which was SCRIBED in my presence. The recorded information has been reviewed and considered accurate. It has been edited as necessary during review. Jonnie Kind, MD

## 2019-06-29 NOTE — Transfer of Care (Signed)
Immediate Anesthesia Transfer of Care Note  Patient: Andrea Eaton  Procedure(s) Performed: DILATATION AND CURETTAGE /HYSTEROSCOPY, removal of endometrial polyp (N/A Vagina )  Patient Location: PACU  Anesthesia Type:General  Level of Consciousness: awake and patient cooperative  Airway & Oxygen Therapy: Patient Spontanous Breathing  Post-op Assessment: Report given to RN and Post -op Vital signs reviewed and stable  Post vital signs: Reviewed and stable  Last Vitals:  Vitals Value Taken Time  BP    Temp    Pulse    Resp    SpO2      Last Pain:  Vitals:   06/29/19 0901  TempSrc: Oral  PainSc: 0-No pain      Patients Stated Pain Goal: 7 ([id number]  Complications: No apparent anesthesia complications

## 2019-06-29 NOTE — Progress Notes (Addendum)
Preoperative History and Physical  Andrea Eaton is a 64 y.o. [id number] here for surgical management of post menopausal bleeding.   No significant preoperative concerns. Endometrial biopsy is benign, but there is extensive polypoid tissue, so a more thorough diagnosis is desired.  Proposed surgery: Dilatation and curettage/ Hysteroscopy  Past Medical History:  Diagnosis Date  . Arthritis    Dr. Sharol Given  . Hypertension   . Prediabetes    Past Surgical History:  Procedure Laterality Date  . COLONOSCOPY  07/2007   Dr. Fuller Plan: diverticulosis, internal hemorrhoids  . COLONOSCOPY N/A 03/11/2018   Procedure: COLONOSCOPY;  Surgeon: Danie Binder, MD;  Location: [facility];  Service: Endoscopy;  Laterality: N/A;  9:30am  . ESOPHAGOGASTRODUODENOSCOPY  07/2007   Dr. Fuller Plan: H.pylori gastritis  . IR US GUIDE VASC ACCESS RIGHT  05/27/2019  . IR VENIPUNCTURE 8YRS/OLDER BY MD  05/27/2019  . POLYPECTOMY  03/11/2018   Procedure: POLYPECTOMY;  Surgeon: Danie Binder, MD;  Location: [facility];  Service: Endoscopy;;  descending colon   OB History  Gravida Para Term Preterm AB Living  3       1 3   SAB TAB Ectopic Multiple Live Births  1       2    # Outcome Date GA Lbr Len/2nd Weight Sex Delivery Anes PTL Lv  3 Gravida     M Vag-Spont   LIV  2 Gravida     F Vag-Spont   LIV  1 SAB         FD  Patient denies any other pertinent gynecologic issues.   No current facility-administered medications on file prior to encounter.   Current Outpatient Medications on File Prior to Encounter  Medication Sig Dispense Refill  . ibuprofen (ADVIL) 800 MG tablet TAKE 1 TABLET BY MOUTH EVERY 8 HOURS AS NEEDED (Patient taking differently: Take 800 mg by mouth every 8 (eight) hours as needed (pain.). ) 30 tablet 1  . Multiple Vitamin (MULTIVITAMIN WITH MINERALS) TABS tablet Take 1 tablet by mouth daily.    . Potassium 99 MG TABS Take 99 mg by mouth daily.    Marland Kitchen torsemide (DEMADEX) 20 MG tablet TAKE 1 TABLET (20 MG  TOTAL) BY MOUTH 2 (TWO) TIMES DAILY AS NEEDED. (Patient taking differently: Take 20-40 mg by mouth See admin instructions. Take 1 tablet (20 mg) by mouth scheduled in the morning, may take additional tablet in the morning (40 mg) if needed for fluid retention.) 180 tablet 1  . vitamin C (ASCORBIC ACID) 500 MG tablet Take 500 mg by mouth daily.    . metoprolol tartrate (LOPRESSOR) 100 MG tablet Take 1 tablet two hours prior to procedure. (Patient not taking: Reported on 05/24/2019) 1 tablet 0  . nitrofurantoin, macrocrystal-monohydrate, (MACROBID) 100 MG capsule Take 1 capsule (100 mg total) by mouth 2 (two) times daily. (Patient not taking: Reported on 04/28/2019) 14 capsule 0   No Known Allergies  Social History:   reports that she has never smoked. She has never used smokeless tobacco. She reports that she does not drink alcohol or use drugs.  Family History  Problem Relation Age of Onset  . Hypertension Mother   . Diabetes Mother   . Hypertension Father   . Heart disease Father   . Diabetes Sister   . Hypertension Sister   . Hyperlipidemia Sister   . Heart disease Sister   . Hypertension Brother   . Diabetes Brother   . Colon cancer Neg Hx  Review of Systems: Noncontributory  PHYSICAL EXAM: There were no vitals taken for this visit. General appearance - alert, well appearing, and in no distress Chest - clear to auscultation, no wheezes, rales or rhonchi, symmetric air entry Heart - normal rate and regular rhythm Abdomen - soft, nontender, nondistended, no masses or organomegaly                      Pelvic - examination deferred Extremities - peripheral pulses normal, no pedal edema, no clubbing or cyanosis  Labs: Results for orders placed or performed during the hospital encounter of 06/28/19 (from the past 336 hour(s))  SARS CORONAVIRUS 2 (TAT 6-24 HRS) Nasopharyngeal Nasopharyngeal Swab   Collection Time: 06/28/19  8:02 AM   Specimen: Nasopharyngeal Swab  Result Value  Ref Range   SARS Coronavirus 2 NEGATIVE NEGATIVE  Results for orders placed or performed during the hospital encounter of 06/25/19 (from the past 336 hour(s))  CBC   Collection Time: 06/25/19  4:23 PM  Result Value Ref Range   WBC 5.4 4.0 - 10.5 K/uL   RBC 4.58 3.87 - 5.11 MIL/uL   Hemoglobin 13.3 12.0 - 15.0 g/dL   HCT 39.4 36.0 - 46.0 %   MCV 86.0 80.0 - 100.0 fL   MCH 29.0 26.0 - 34.0 pg   MCHC 33.8 30.0 - 36.0 g/dL   RDW 14.3 11.5 - 15.5 %   Platelets 259 150 - 400 K/uL   nRBC 0.0 0.0 - 0.2 %  Comprehensive metabolic panel   Collection Time: 06/25/19  4:23 PM  Result Value Ref Range   Sodium 134 (L) 135 - 145 mmol/L   Potassium 3.7 3.5 - 5.1 mmol/L   Chloride 101 98 - 111 mmol/L   CO2 25 22 - 32 mmol/L   Glucose, Bld 75 70 - 99 mg/dL   BUN 23 8 - 23 mg/dL   Creatinine, Ser 0.81 0.44 - 1.00 mg/dL   Calcium 9.1 8.9 - 10.3 mg/dL   Total Protein 7.8 6.5 - 8.1 g/dL   Albumin 3.8 3.5 - 5.0 g/dL   AST 21 15 - 41 U/L   ALT 18 0 - 44 U/L   Alkaline Phosphatase 83 38 - 126 U/L   Total Bilirubin 1.0 0.3 - 1.2 mg/dL   GFR calc non Af Amer >60 >60 mL/min   GFR calc Af Amer >60 >60 mL/min   Anion gap 8 5 - 15  Urinalysis, Routine w reflex microscopic   Collection Time: 06/25/19  4:23 PM  Result Value Ref Range   Color, Urine STRAW (A) YELLOW   APPearance CLEAR CLEAR   Specific Gravity, Urine 1.002 (L) 1.005 - 1.030   pH 6.0 5.0 - 8.0   Glucose, UA NEGATIVE NEGATIVE mg/dL   Hgb urine dipstick LARGE (A) NEGATIVE   Bilirubin Urine NEGATIVE NEGATIVE   Ketones, ur NEGATIVE NEGATIVE mg/dL   Protein, ur NEGATIVE NEGATIVE mg/dL   Nitrite NEGATIVE NEGATIVE   Leukocytes,Ua NEGATIVE NEGATIVE   RBC / HPF 0-5 0 - 5 RBC/hpf   WBC, UA 0-5 0 - 5 WBC/hpf   Bacteria, UA RARE (A) NONE SEEN   Squamous Epithelial / LPF 0-5 0 - 5    Imaging Studies:   Assessment: Patient Active Problem List   Diagnosis Date Noted  . Postmenopausal bleeding 06/23/2019  . Thickened endometrium 06/23/2019   . Gait instability 04/12/2019  . Borderline diabetes 08/05/2018  . Gout 08/05/2018  . Mild anxiety 03/06/2018  .  Heme positive stool 12/22/2017  . Dizziness 09/01/2017  . Constipation 04/29/2017  . Peripheral edema 10/09/2015  . Hemorrhoids 10/28/2014  . Routine general medical examination at a health care facility 03/19/2013  . 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 Hot flashes 10/15/2012  . Class 3 obesity 01/14/2012  . Insomnia 01/14/2012  . OA (osteoarthritis) 01/14/2012  . Essential hypertension 08/13/2007    Plan: Patient will undergo surgical management with hysteroscopy, dilation and curettage.Jonnie Kind, MD  06/29/2019 7:15 AM   By signing my name below, I, Jacqualyn Posey, attest that this documentation has been prepared under the direction and in the presence of Jonnie Kind, MD. Electronically Signed: [place]. 06/29/19. 7:15 AM.  I personally performed the services described in this documentation, which was SCRIBED in my presence. The recorded information has been reviewed and considered accurate. It has been edited as necessary during review. Jonnie Kind, MD

## 2019-06-29 NOTE — Anesthesia Preprocedure Evaluation (Addendum)
Anesthesia Evaluation  Patient identified by MRN, date of birth, ID band Patient awake    Reviewed: Allergy & Precautions, NPO status , Patient's Chart, lab work & pertinent test results, reviewed documented beta blocker date and time   History of Anesthesia Complications Negative for: history of anesthetic complications  Airway        Dental   Pulmonary    Pulmonary exam normal breath sounds clear to auscultation       Cardiovascular hypertension, Pt. on medications and Pt. on home beta blockers Normal cardiovascular exam Rhythm:Regular Rate:Normal     Neuro/Psych Anxiety  Neuromuscular disease    GI/Hepatic negative GI ROS, Neg liver ROS,   Endo/Other  Morbid obesity  Renal/GU negative Renal ROS     Musculoskeletal  (+) Arthritis  (gout),   Abdominal   Peds  Hematology   Anesthesia Other Findings   Reproductive/Obstetrics negative OB ROS                             Anesthesia Physical Anesthesia Plan  ASA: III  Anesthesia Plan: General   Post-op Pain Management:    Induction: Intravenous  PONV Risk Score and Plan: 4 or greater and Ondansetron, Dexamethasone, Metaclopromide and Midazolam  Airway Management Planned: LMA  Additional Equipment:   Intra-op Plan:   Post-operative Plan:   Informed Consent: I have reviewed the patients History and Physical, chart, labs and discussed the procedure including the risks, benefits and alternatives for the proposed anesthesia with the patient or authorized representative who has indicated his/her understanding and acceptance.     Dental advisory given  Plan Discussed with: CRNA and Surgeon  Anesthesia Plan Comments:        Anesthesia Quick Evaluation

## 2019-06-29 NOTE — Anesthesia Postprocedure Evaluation (Signed)
Anesthesia Post Note  Patient: Andrea Eaton  Procedure(s) Performed: DILATATION AND CURETTAGE /HYSTEROSCOPY, removal of endometrial polyp (N/A Vagina )  Patient location during evaluation: PACU Anesthesia Type: General Level of consciousness: awake Pain management: pain level controlled Vital Signs Assessment: post-procedure vital signs reviewed and stable Respiratory status: spontaneous breathing Cardiovascular status: stable Postop Assessment: no apparent nausea or vomiting Anesthetic complications: no     Last Vitals:  Vitals:   06/29/19 0902 06/29/19 0915  BP: 126/68   Pulse: 79 78  Resp: 18   Temp:    SpO2: 98% 97%    Last Pain:  Vitals:   06/29/19 0901  TempSrc: Oral  PainSc: 0-No pain                 Everette Rank

## 2019-06-29 NOTE — Anesthesia Procedure Notes (Addendum)
Procedure Name: LMA Insertion Date/Time: 06/29/2019 10:41 AM Performed by: Georgeanne Nim, CRNA Pre-anesthesia Checklist: Patient identified, Patient being monitored, Emergency Drugs available, Timeout performed and Suction available Patient Re-evaluated:Patient Re-evaluated prior to induction Oxygen Delivery Method: Circle System Utilized Preoxygenation: Pre-oxygenation with 100% oxygen Induction Type: IV induction Ventilation: Mask ventilation without difficulty LMA: LMA inserted LMA Size: 5.0 Number of attempts: 1 Placement Confirmation: positive ETCO2 and breath sounds checked- equal and bilateral

## 2019-06-30 LAB — SURGICAL PATHOLOGY

## 2019-07-04 ENCOUNTER — Telehealth: Payer: Self-pay | Admitting: Obstetrics and Gynecology

## 2019-07-04 ENCOUNTER — Encounter: Payer: Self-pay | Admitting: Obstetrics and Gynecology

## 2019-07-04 DIAGNOSIS — C541 Malignant neoplasm of endometrium: Secondary | ICD-10-CM

## 2019-07-04 NOTE — Telephone Encounter (Signed)
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 Pt made aware she should receive a call within 3 days for appt in [facility] Dr Denman George or associates.

## 2019-07-04 NOTE — Progress Notes (Signed)
Large endometrial polyp, removed  hysteroscopically.FIGO GRADE I No other areas suspicious.  Will refer to Dr Denman George. PT AWARE

## 2019-07-08 ENCOUNTER — Telehealth: Payer: Self-pay | Admitting: Obstetrics and Gynecology

## 2019-07-08 NOTE — Addendum Note (Signed)
Addended by: Octaviano Glow on: 07/08/2019 01:47 PM   Modules accepted: Orders

## 2019-07-08 NOTE — Telephone Encounter (Signed)
Patient got a phone call by Dr Glo Herring on Sunday that he was going to contact a Dr Denman George and for her to call our office if she had not heard from them by Wed. SHe has not heard from them yet and wanted to follow up with him.

## 2019-07-08 NOTE — Telephone Encounter (Signed)
Referral placed to Dr Denman George per Dr Johnnye Sima request.  Pt made aware and will contact us next week if she has not heard from them.

## 2019-07-12 ENCOUNTER — Encounter: Payer: BC Managed Care – PPO | Admitting: Obstetrics and Gynecology

## 2019-07-12 ENCOUNTER — Telehealth: Payer: Self-pay

## 2019-07-12 ENCOUNTER — Telehealth: Payer: Self-pay | Admitting: Obstetrics and Gynecology

## 2019-07-12 ENCOUNTER — Telehealth: Payer: Self-pay | Admitting: *Deleted

## 2019-07-12 NOTE — Telephone Encounter (Signed)
Pt has been waiting for a call from Dr Denman George office per a conversation she had with him a week ago. She called last Thursday to follow up and she has not had a return call yet. Please advise pt .  Thank You

## 2019-07-12 NOTE — Telephone Encounter (Signed)
Gave Andrea Eaton a New patient appointment for tomorrow 07-13-19 at 1015 am with arrival at [facility]. Reviewed check in policy and that she can have one person with her for visit and she will have an internal exam as well.  Notified Dr. Johnnye Sima office of scheduled appointment.

## 2019-07-12 NOTE — Telephone Encounter (Signed)
Patient states she is aware of her appt tomorrow at 10:15 with Dr Denman George.

## 2019-07-13 ENCOUNTER — Other Ambulatory Visit: Payer: Self-pay | Admitting: Gynecologic Oncology

## 2019-07-13 ENCOUNTER — Inpatient Hospital Stay: Payer: BC Managed Care – PPO | Attending: Gynecologic Oncology | Admitting: Gynecologic Oncology

## 2019-07-13 ENCOUNTER — Encounter: Payer: Self-pay | Admitting: Gynecologic Oncology

## 2019-07-13 ENCOUNTER — Other Ambulatory Visit: Payer: Self-pay

## 2019-07-13 ENCOUNTER — Ambulatory Visit: Payer: BC Managed Care – PPO

## 2019-07-13 VITALS — BP 122/80 | HR 78 | Temp 98.0°F | Resp 16 | Ht 62.0 in | Wt 247.4 lb

## 2019-07-13 DIAGNOSIS — Z791 Long term (current) use of non-steroidal anti-inflammatories (NSAID): Secondary | ICD-10-CM | POA: Insufficient documentation

## 2019-07-13 DIAGNOSIS — Z8249 Family history of ischemic heart disease and other diseases of the circulatory system: Secondary | ICD-10-CM | POA: Insufficient documentation

## 2019-07-13 DIAGNOSIS — Z6841 Body Mass Index (BMI) 40.0 and over, adult: Secondary | ICD-10-CM | POA: Diagnosis not present

## 2019-07-13 DIAGNOSIS — D259 Leiomyoma of uterus, unspecified: Secondary | ICD-10-CM | POA: Insufficient documentation

## 2019-07-13 DIAGNOSIS — C541 Malignant neoplasm of endometrium: Secondary | ICD-10-CM | POA: Diagnosis not present

## 2019-07-13 DIAGNOSIS — Z79899 Other long term (current) drug therapy: Secondary | ICD-10-CM | POA: Insufficient documentation

## 2019-07-13 DIAGNOSIS — Z833 Family history of diabetes mellitus: Secondary | ICD-10-CM | POA: Insufficient documentation

## 2019-07-13 DIAGNOSIS — Z8349 Family history of other endocrine, nutritional and metabolic diseases: Secondary | ICD-10-CM | POA: Insufficient documentation

## 2019-07-13 DIAGNOSIS — I10 Essential (primary) hypertension: Secondary | ICD-10-CM | POA: Insufficient documentation

## 2019-07-13 MED ORDER — SENNOSIDES-DOCUSATE SODIUM 8.6-50 MG PO TABS: 2.0000 | ORAL_TABLET | Freq: Every day | ORAL | 0 refills | Status: DC

## 2019-07-13 MED ORDER — TRAMADOL HCL 50 MG PO TABS: 50.0000 mg | ORAL_TABLET | Freq: Four times a day (QID) | ORAL | 0 refills | Status: DC | PRN

## 2019-07-13 NOTE — Patient Instructions (Signed)
Preparing for your Surgery  Plan for surgery on July 29, 2019 with Dr. Everitt Amber at [facility] will be scheduled for a robotic assisted total laparoscopic hysterectomy, bilateral salpingo-oophorectomy, sentinel lymph node biopsy, possible lymph node dissection.   Pre-operative Testing -You will receive a phone call from presurgical testing at [facility] to arrange for a pre-operative appointment over the phone, lab appointment, and COVID test. The COVID test normally happens 3 days prior to the surgery and they ask that you self quarantine after the test up until surgery to decrease chance of exposure.  -Bring your insurance card, copy of an advanced directive if applicable, medication list  -At that visit, you will be asked to sign a consent for a possible blood transfusion in case a transfusion becomes necessary during surgery.  The need for a blood transfusion is rare but having consent is a necessary part of your care.     -You should not be taking blood thinners or aspirin at least ten days prior to surgery unless instructed by your surgeon. If you need something for pain, use Tylenol before surgery instead of ibuprofen.  -Do not take supplements such as fish oil (omega 3), red yeast rice, turmeric before your surgery.   Day Before Surgery at [facility] will be asked to take in a light diet the day before surgery.  Avoid carbonated beverages.  You will be advised you can have clear liquids after midnight and up until 3 hours before your surgery.    Eat a light diet the day before surgery.  Examples including soups, broths, toast, yogurt, mashed potatoes.  Things to avoid include carbonated beverages (fizzy beverages), raw fruits and raw vegetables, or beans.   If your bowels are filled with gas, your surgeon will have difficulty visualizing your pelvic organs which increases your surgical risks.  Your role in recovery Your role is to become active as soon as  directed by your doctor, while still giving yourself time to heal.  Rest when you feel tired. You will be asked to do the following in order to speed your recovery:  - Cough and breathe deeply. This helps to clear and expand your lungs and can prevent pneumonia after surgery.  - [place]. Do mild physical activity. Walking or moving your legs help your circulation and body functions return to normal. Do not try to get up or walk alone the first time after surgery.   -If you develop swelling on one leg or the other, pain in the back of your leg, redness/warmth in one of your legs, please call the office or go to the Emergency Room to have a doppler to rule out a blood clot. For shortness of breath, chest pain-seek care in the Emergency Room as soon as possible. - Actively manage your pain. Managing your pain lets you move in comfort. We will ask you to rate your pain on a scale of zero to 10. It is your responsibility to tell your doctor or nurse where and how much you hurt so your pain can be treated.  Special Considerations -If you are diabetic, you may be placed on insulin after surgery to have closer control over your blood sugars to promote healing and recovery.  This does not mean that you will be discharged on insulin.  If applicable, your oral antidiabetics will be resumed when you are tolerating a solid diet.  -Your final pathology results from surgery should be  available around one week after surgery and the results will be relayed to you when available.  -Dr. Lahoma Crocker is the surgeon that assists your GYN Oncologist with surgery.  If you end up staying the night, the next day after your surgery you will either see Dr. Denman George, Dr. Berline Lopes, or Dr. Lahoma Crocker.  -FMLA forms can be faxed to [phone] and please allow 5-7 business days for completion.  Pain Management After Surgery -You have been prescribed your pain medication and bowel regimen medications  before surgery so that you can have these available when you are discharged from the hospital. The pain medication is for use ONLY AFTER surgery and a new prescription will not be given.   -Make sure that you have Tylenol and Ibuprofen at home to use on a regular basis after surgery for pain control. We recommend alternating the medications every hour to six hours since they work differently and are processed in the body differently for pain relief.  -Review the attached handout on narcotic use and their risks and side effects.   Bowel Regimen -You have been prescribed Sennakot-S to take nightly to prevent constipation especially if you are taking the narcotic pain medication intermittently.  It is important to prevent constipation and drink adequate amounts of liquids. You can stop taking this medication when you are not taking pain medication and you are back on your normal bowel routine.  Risks of Surgery Risks of surgery are low but include bleeding, infection, damage to surrounding structures, re-operation, blood clots, and very rarely death.   Blood Transfusion Information (For the consent to be signed before surgery)  We will be checking your blood type before surgery so in case of emergencies, we will know what type of blood you would need.                                            WHAT IS A BLOOD TRANSFUSION?  A transfusion is the replacement of blood or some of its parts. Blood is made up of multiple cells which provide different functions.  Red blood cells carry oxygen and are used for blood loss replacement.  White blood cells fight against infection.  Platelets control bleeding.  Plasma helps clot blood.  Other blood products are available for specialized needs, such as hemophilia or other clotting disorders. BEFORE THE TRANSFUSION  Who gives blood for transfusions?   You may be able to donate blood to be used at a later date on yourself (autologous  donation).  Relatives can be asked to donate blood. This is generally not any safer than if you have received blood from a stranger. The same precautions are taken to ensure safety when a relative's blood is donated.  Healthy volunteers who are fully evaluated to make sure their blood is safe. This is blood bank blood. Transfusion therapy is the safest it has ever been in the practice of medicine. Before blood is taken from a donor, a complete history is taken to make sure that person has no history of diseases nor engages in risky social behavior (examples are intravenous drug use or sexual activity with multiple partners). The donor's travel history is screened to minimize risk of transmitting infections, such as malaria. The donated blood is tested for signs of infectious diseases, such as HIV and hepatitis. The blood is then tested to be sure it  is compatible with you in order to minimize the chance of a transfusion reaction. If you or a relative donates blood, this is often done in anticipation of surgery and is not appropriate for emergency situations. It takes many days to process the donated blood. RISKS AND COMPLICATIONS Although transfusion therapy is very safe and saves many lives, the main dangers of transfusion include:   Getting an infectious disease.  Developing a transfusion reaction. This is an allergic reaction to something in the blood you were given. Every precaution is taken to prevent this. The decision to have a blood transfusion has been considered carefully by your caregiver before blood is given. Blood is not given unless the benefits outweigh the risks.  AFTER SURGERY INSTRUCTIONS  Return to work: 4-6 weeks if applicable  Activity: 1. Be up and out of the bed during the day.  Take a nap if needed.  You may walk up steps but be careful and use the hand rail.  Stair climbing will tire you more than you think, you may need to stop part way and rest.   2. No lifting or  straining for 6 weeks over 10 pounds. No pushing, pulling, straining for 6 weeks.  3. No driving for 1 week(s).  Do not drive if you are taking narcotic pain medicine and make sure that your reaction time has returned.   4. You can shower as soon as the next day after surgery. Shower daily.  Use soap and water on your incision and pat dry; don't rub.  No tub baths or submerging your body in water until cleared by your surgeon. If you have the soap that was given to you by pre-surgical testing that was used before surgery, you do not need to use it afterwards because this can irritate your incisions.   5. No sexual activity and nothing in the vagina for 8 weeks.  6. You may experience a small amount of clear drainage from your incisions, which is normal.  If the drainage persists, increases, or changes color please call the office.  7. Do not use creams, lotions, or ointments such as neosporin on your incisions after surgery until advised by your surgeon because they can cause removal of the dermabond glue on your incisions.    8. You may experience vaginal spotting after surgery or around the 6-8 week mark from surgery when the stitches at the top of the vagina begin to dissolve.  The spotting is normal but if you experience heavy bleeding, call our office.  9. Take Tylenol or ibuprofen first for pain and only use narcotic pain medication for severe pain not relieved by the Tylenol or Ibuprofen.  Monitor your Tylenol intake to a max of 4,000 mg.  Diet: 1. Low sodium Heart Healthy Diet is recommended.  2. It is safe to use a laxative, such as Miralax or Colace, if you have difficulty moving your bowels. You can take Sennakot at bedtime every evening to keep bowel movements regular and to prevent constipation.    Wound Care: 1. Keep clean and dry.  Shower daily.  Reasons to call the Doctor:  Fever - Oral temperature greater than 100.4 degrees Fahrenheit  Foul-smelling vaginal  discharge  Difficulty urinating  Nausea and vomiting  Increased pain at the site of the incision that is unrelieved with pain medicine.  Difficulty breathing with or without chest pain  New calf pain especially if only on one side  Sudden, continuing increased vaginal bleeding with or  without clots.   Contacts: For questions or concerns you should contact:  Dr. Everitt Amber at [phone]  Joylene John, NP at [phone]  After Hours: call [phone] and have the GYN Oncologist paged/contacted

## 2019-07-13 NOTE — Progress Notes (Signed)
Consult Note: Gyn-Onc  Consult was requested by Dr. Glo Herring for the evaluation of Andrea Eaton 64 y.o. female  CC:  Chief Complaint  Patient presents with  . Endometrial cancer [facility])    New Patient    Assessment/Plan:  Andrea. BURKLEIGH HOR  is a 64 y.o.  year old with FIGO grade 1 endometrioid endometrial cancer in the setting of morbid obesity (BMI 45).  A detailed discussion was held with the patient and her family with regard to to her endometrial cancer diagnosis. We discussed the standard management options for uterine cancer which includes surgery followed possibly by adjuvant therapy depending on the results of surgery. The options for surgical management include a hysterectomy and removal of the tubes and ovaries possibly with removal of pelvic and para-aortic lymph nodes.If feasible, a minimally invasive approach including a robotic hysterectomy or laparoscopic hysterectomy have benefits including shorter hospital stay, recovery time and better wound healing than with open surgery. The patient has been counseled about these surgical options and the risks of surgery in general including infection, bleeding, damage to surrounding structures (including bowel, bladder, ureters, nerves or vessels), and the postoperative risks of PE/ DVT, and lymphedema. I extensively reviewed the additional risks of robotic hysterectomy including possible need for conversion to open laparotomy.  I discussed positioning during surgery of trendelenberg and risks of minor facial swelling and care we take in preoperative positioning.  After counseling and consideration of her options, she desires to proceed with robotic assisted total hysterectomy with bilateral sapingo-oophorectomy and SLN biopsy.   I explained that her risks of complication are higher due to her obesity. I offered her medical therapy with progesterone as an alternative which would avoid these risks. She declined.   She will be seen by anesthesia  for preoperative clearance and discussion of postoperative pain management.  She was given the opportunity to ask questions, which were answered to her satisfaction, and she is agreement with the above mentioned plan of care.  HPI: Andrea Eaton is a 64 year old P3 who was seen in consultation at the request of Dr Glo Herring for evaluation of endometrial cancer.  The patient reported a 2-year history of postmenopausal bleeding.  She has a primary care doctor whom she sees regularly, Dr. Buelah Manis, but did not mention the symptoms to Dr. Buelah Manis until she saw her in February 2021.  At that time Dr. Buelah Manis performed a Pap test that was normal and referred the patient to Dr. Glo Herring who saw her in March 2021.  Dr. Glo Herring, and OB/GYN then performed a transvaginal ultrasound scan on May 12, 2019 which revealed a uterus measuring 7.9 x 6.3 x 6.7 cm with multiple small fibroids.  The endometrium was 18 mm and complex and thickened.  The right and left ovaries were grossly normal.  She was then taken to the operating room for a D&C procedure on Jun 29, 2019 which revealed complex atypical hyperplasia with foci worrisome for FIGO grade 1 endometrioid carcinoma involving endometrioid type polyps.  The patient's medical history is significant for morbid obesity.  She has a BMI of 45 kg meters squared.  She has hypertension, and prediabetes with her most recent hemoglobin A1c 5.7 on April 13, 2019.  Her prediabetes is diet controlled.  She has never had major surgery with her only surgery being the D&C procedure.  She has had 3 prior vaginal deliveries including a stillbirth, and 2 live-born children.  Her only prior hospitalization was for upper thigh  cellulitis in approximately 2015.  Her past family history is significant for an aunt with cervical cancer.  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  She lives with her husband who is a diabetic and double amputee but fairly independent with activities of  daily living at home.  She also lives with her 26 year old grandson.  Current Meds:  Outpatient Encounter Medications as of 07/13/2019  Medication Sig  . allopurinol (ZYLOPRIM) 100 MG tablet TAKE 1 TABLET BY MOUTH EVERY DAY (Patient taking differently: Take 100 mg by mouth daily. )  . ibuprofen (ADVIL) 800 MG tablet TAKE 1 TABLET BY MOUTH EVERY 8 HOURS AS NEEDED (Patient taking differently: Take 800 mg by mouth every 8 (eight) hours as needed (pain.). )  . Multiple Vitamin (MULTIVITAMIN WITH MINERALS) TABS tablet Take 1 tablet by mouth daily.  . Potassium 99 MG TABS Take 99 mg by mouth daily.  Marland Kitchen torsemide (DEMADEX) 20 MG tablet TAKE 1 TABLET (20 MG TOTAL) BY MOUTH 2 (TWO) TIMES DAILY AS NEEDED. (Patient taking differently: Take 20-40 mg by mouth See admin instructions. Take 1 tablet (20 mg) by mouth scheduled in the morning, may take additional tablet in the morning (40 mg) if needed for fluid retention.)  . vitamin C (ASCORBIC ACID) 500 MG tablet Take 500 mg by mouth daily.   No facility-administered encounter medications on file as of 07/13/2019.    Allergy: No Known Allergies  Social Hx:   Social History   Socioeconomic History  . Marital status: Married    Spouse name: Not on file  . Number of children: Not on file  . Years of education: Not on file  . Highest education level: Not on file  Occupational History  . Not on file  Tobacco Use  . Smoking status: Never Smoker  . Smokeless tobacco: Never Used  Substance and Sexual Activity  . Alcohol use: No  . Drug use: No  . Sexual activity: Never  Other Topics Concern  . Not on file  Social History Narrative  . Not on file   Social Determinants of Health   Financial Resource Strain:   . Difficulty of Paying Living Expenses:   Food Insecurity:   . Worried About Charity fundraiser in the Last Year:   . Arboriculturist in the Last Year:   Transportation Needs:   . Film/video editor (Medical):   Marland Kitchen Lack of  Transportation (Non-Medical):   Physical Activity:   . Days of Exercise per Week:   . Minutes of Exercise per Session:   Stress:   . Feeling of Stress :   Social Connections:   . Frequency of Communication with Friends and Family:   . Frequency of Social Gatherings with Friends and Family:   . Attends Religious Services:   . Active Member of Clubs or Organizations:   . Attends Archivist Meetings:   Marland Kitchen Marital Status:   Intimate Partner Violence:   . Fear of Current or Ex-Partner:   . Emotionally Abused:   Marland Kitchen Physically Abused:   . Sexually Abused:     Past Surgical Hx:  Past Surgical History:  Procedure Laterality Date  . COLONOSCOPY  07/2007   Dr. Fuller Plan: diverticulosis, internal hemorrhoids  . COLONOSCOPY N/A 03/11/2018   Procedure: COLONOSCOPY;  Surgeon: Danie Binder, MD;  [facility];  Service: Endoscopy;  Laterality: N/A;  9:30am  . ESOPHAGOGASTRODUODENOSCOPY  07/2007   Dr. Fuller Plan: H.pylori gastritis  . HYSTEROSCOPY  WITH D & C N/A 06/29/2019   Procedure: DILATATION AND CURETTAGE /HYSTEROSCOPY, removal of endometrial polyp;  Surgeon: Jonnie Kind, MD;  Location: [facility];  Service: Gynecology;  Laterality: N/A;  . IR US GUIDE VASC ACCESS RIGHT  05/27/2019  . IR VENIPUNCTURE 56YRS/OLDER BY MD  05/27/2019  . POLYPECTOMY  03/11/2018   Procedure: POLYPECTOMY;  Surgeon: Danie Binder, MD;  [facility]: [facility];  Service: Endoscopy;;  descending colon    Past Medical Hx:  Past Medical History:  Diagnosis Date  . Arthritis    Dr. Sharol Given  . Hypertension   . Prediabetes     Past Gynecological History:  See HPI No LMP recorded. Patient is postmenopausal.  Family Hx:  Family History  Problem Relation Age of Onset  . Hypertension Mother   . Diabetes Mother   . Hypertension Father   . Heart disease Father   . Diabetes Sister   . Hypertension Sister   . Hyperlipidemia Sister   . Heart disease Sister   . Hypertension Brother   . Diabetes Brother    . Colon cancer Neg Hx     Review of Systems:  Constitutional  Feels well,    ENT Normal appearing ears and nares bilaterally Skin/Breast  No rash, sores, jaundice, itching, dryness Cardiovascular  No chest pain, shortness of breath, or edema  Pulmonary  No cough or wheeze.  Gastro Intestinal  No nausea, vomitting, or diarrhoea. No bright red blood per rectum, no abdominal pain, change in bowel movement, or constipation.  Genito Urinary  No frequency, urgency, dysuria, + postmenopausal bleeding  Musculo Skeletal  No myalgia, arthralgia, joint swelling or pain  Neurologic  No weakness, numbness, change in gait,  Psychology  No depression, anxiety, insomnia.   Vitals:  Blood pressure 122/80, pulse 78, temperature 98 F (36.7 C), temperature source Oral, resp. rate 16, height 5\' 2"  (1.575 m), weight 247 lb 6 oz (112.2 kg), SpO2 100 %.  Physical Exam: WD in NAD Neck  Supple NROM, without any enlargements.  Lymph Node Survey No cervical supraclavicular or inguinal adenopathy Cardiovascular  Pulse normal rate, regularity and rhythm. S1 and S2 normal.  Lungs  Clear to auscultation bilateraly, without wheezes/crackles/rhonchi. Good air movement.  Skin  No rash/lesions/breakdown  Psychiatry  Alert and oriented to person, place, and time  Abdomen  Normoactive bowel sounds, abdomen soft, non-tender and obese without evidence of hernia.  Back No CVA tenderness Genito Urinary  Vulva/vagina: Normal external female genitalia.   No lesions. No discharge or bleeding.  Bladder/urethra:  No lesions or masses,  Vagina: capacious upper vagina, no lesions.  Cervix: Normal appearing, no lesions.  Uterus:  Small, mobile, no parametrial involvement or nodularity.  Adnexa: no palpable masses. Rectal  deferred Extremities  No bilateral cyanosis, clubbing or edema.   Thereasa Solo, MD  07/13/2019, 10:48 AM

## 2019-07-13 NOTE — H&P (View-Only) (Signed)
Consult Note: Gyn-Onc  Consult was requested by Dr. Glo Herring for the evaluation of Andrea Eaton 64 y.o. female  CC:  Chief Complaint  Patient presents with  . Endometrial cancer [facility])    New Patient    Assessment/Plan:  Andrea Eaton  is a 64 y.o.  year old with FIGO grade 1 endometrioid endometrial cancer in the setting of morbid obesity (BMI 45).  A detailed discussion was held with the patient and her family with regard to to her endometrial cancer diagnosis. We discussed the standard management options for uterine cancer which includes surgery followed possibly by adjuvant therapy depending on the results of surgery. The options for surgical management include a hysterectomy and removal of the tubes and ovaries possibly with removal of pelvic and para-aortic lymph nodes.If feasible, a minimally invasive approach including a robotic hysterectomy or laparoscopic hysterectomy have benefits including shorter hospital stay, recovery time and better wound healing than with open surgery. The patient has been counseled about these surgical options and the risks of surgery in general including infection, bleeding, damage to surrounding structures (including bowel, bladder, ureters, nerves or vessels), and the postoperative risks of PE/ DVT, and lymphedema. I extensively reviewed the additional risks of robotic hysterectomy including possible need for conversion to open laparotomy.  I discussed positioning during surgery of trendelenberg and risks of minor facial swelling and care we take in preoperative positioning.  After counseling and consideration of her options, she desires to proceed with robotic assisted total hysterectomy with bilateral sapingo-oophorectomy and SLN biopsy.   I explained that her risks of complication are higher due to her obesity. I offered her medical therapy with progesterone as an alternative which would avoid these risks. She declined.   She will be seen by anesthesia  for preoperative clearance and discussion of postoperative pain management.  She was given the opportunity to ask questions, which were answered to her satisfaction, and she is agreement with the above mentioned plan of care.  HPI: Andrea Eaton is a 64 year old P3 who was seen in consultation at the request of Dr Glo Herring for evaluation of endometrial cancer.  The patient reported a 2-year history of postmenopausal bleeding.  She has a primary care doctor whom she sees regularly, Dr. Buelah Manis, but did not mention the symptoms to Dr. Buelah Manis until she saw her in February 2021.  At that time Dr. Buelah Manis performed a Pap test that was normal and referred the patient to Dr. Glo Herring who saw her in March 2021.  Dr. Glo Herring, and OB/GYN then performed a transvaginal ultrasound scan on May 12, 2019 which revealed a uterus measuring 7.9 x 6.3 x 6.7 cm with multiple small fibroids.  The endometrium was 18 mm and complex and thickened.  The right and left ovaries were grossly normal.  She was then taken to the operating room for a D&C procedure on Jun 29, 2019 which revealed complex atypical hyperplasia with foci worrisome for FIGO grade 1 endometrioid carcinoma involving endometrioid type polyps.  The patient's medical history is significant for morbid obesity.  She has a BMI of 45 kg meters squared.  She has hypertension, and prediabetes with her most recent hemoglobin A1c 5.7 on April 13, 2019.  Her prediabetes is diet controlled.  She has never had major surgery with her only surgery being the D&C procedure.  She has had 3 prior vaginal deliveries including a stillbirth, and 2 live-born children.  Her only prior hospitalization was for upper thigh  cellulitis in approximately 2015.  Her past family history is significant for an aunt with cervical cancer.  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  She lives with her husband who is a diabetic and double amputee but fairly independent with activities of  daily living at home.  She also lives with her 80 year old grandson.  Current Meds:  Outpatient Encounter Medications as of 64/25/2021  Medication Sig  . allopurinol (ZYLOPRIM) 100 MG tablet TAKE 1 TABLET BY MOUTH EVERY DAY (Patient taking differently: Take 100 mg by mouth daily. )  . ibuprofen (ADVIL) 800 MG tablet TAKE 1 TABLET BY MOUTH EVERY 8 HOURS AS NEEDED (Patient taking differently: Take 800 mg by mouth every 8 (eight) hours as needed (pain.). )  . Multiple Vitamin (MULTIVITAMIN WITH MINERALS) TABS tablet Take 1 tablet by mouth daily.  . Potassium 99 MG TABS Take 99 mg by mouth daily.  Marland Kitchen torsemide (DEMADEX) 20 MG tablet TAKE 1 TABLET (20 MG TOTAL) BY MOUTH 2 (TWO) TIMES DAILY AS NEEDED. (Patient taking differently: Take 20-40 mg by mouth See admin instructions. Take 1 tablet (20 mg) by mouth scheduled in the morning, may take additional tablet in the morning (40 mg) if needed for fluid retention.)  . vitamin C (ASCORBIC ACID) 500 MG tablet Take 500 mg by mouth daily.   No facility-administered encounter medications on file as of 64/25/2021.    Allergy: No Known Allergies  Social Hx:   Social History   Socioeconomic History  . Marital status: Married    Spouse name: Not on file  . Number of children: Not on file  . Years of education: Not on file  . Highest education level: Not on file  Occupational History  . Not on file  Tobacco Use  . Smoking status: Never Smoker  . Smokeless tobacco: Never Used  Substance and Sexual Activity  . Alcohol use: No  . Drug use: No  . Sexual activity: Never  Other Topics Concern  . Not on file  Social History Narrative  . Not on file   Social Determinants of Health   Financial Resource Strain:   . Difficulty of Paying Living Expenses:   Food Insecurity:   . Worried About Charity fundraiser in the Last Year:   . Arboriculturist in the Last Year:   Transportation Needs:   . Film/video editor (Medical):   Marland Kitchen Lack of  Transportation (Non-Medical):   Physical Activity:   . Days of Exercise per Week:   . Minutes of Exercise per Session:   Stress:   . Feeling of Stress :   Social Connections:   . Frequency of Communication with Friends and Family:   . Frequency of Social Gatherings with Friends and Family:   . Attends Religious Services:   . Active Member of Clubs or Organizations:   . Attends Archivist Meetings:   Marland Kitchen Marital Status:   Intimate Partner Violence:   . Fear of Current or Ex-Partner:   . Emotionally Abused:   Marland Kitchen Physically Abused:   . Sexually Abused:     Past Surgical Hx:  Past Surgical History:  Procedure Laterality Date  . COLONOSCOPY  07/2007   Dr. Fuller Plan: diverticulosis, internal hemorrhoids  . COLONOSCOPY N/A 03/11/2018   Procedure: COLONOSCOPY;  Surgeon: Danie Binder, MD;  [facility];  Service: Endoscopy;  Laterality: N/A;  9:30am  . ESOPHAGOGASTRODUODENOSCOPY  07/2007   Dr. Fuller Plan: H.pylori gastritis  . HYSTEROSCOPY  WITH D & C N/A 06/29/2019   Procedure: DILATATION AND CURETTAGE /HYSTEROSCOPY, removal of endometrial polyp;  Surgeon: Jonnie Kind, MD;  Location: [facility];  Service: Gynecology;  Laterality: N/A;  . IR US GUIDE VASC ACCESS RIGHT  05/27/2019  . IR VENIPUNCTURE 75YRS/OLDER BY MD  05/27/2019  . POLYPECTOMY  03/11/2018   Procedure: POLYPECTOMY;  Surgeon: Danie Binder, MD;  [facility];  Service: Endoscopy;;  descending colon    Past Medical Hx:  Past Medical History:  Diagnosis Date  . Arthritis    Dr. Sharol Given  . Hypertension   . Prediabetes     Past Gynecological History:  See HPI No LMP recorded. Patient is postmenopausal.  Family Hx:  Family History  Problem Relation Age of Onset  . Hypertension Mother   . Diabetes Mother   . Hypertension Father   . Heart disease Father   . Diabetes Sister   . Hypertension Sister   . Hyperlipidemia Sister   . Heart disease Sister   . Hypertension Brother   . Diabetes Brother    . Colon cancer Neg Hx     Review of Systems:  Constitutional  Feels well,    ENT Normal appearing ears and nares bilaterally Skin/Breast  No rash, sores, jaundice, itching, dryness Cardiovascular  No chest pain, shortness of breath, or edema  Pulmonary  No cough or wheeze.  Gastro Intestinal  No nausea, vomitting, or diarrhoea. No bright red blood per rectum, no abdominal pain, change in bowel movement, or constipation.  Genito Urinary  No frequency, urgency, dysuria, + postmenopausal bleeding  Musculo Skeletal  No myalgia, arthralgia, joint swelling or pain  Neurologic  No weakness, numbness, change in gait,  Psychology  No depression, anxiety, insomnia.   Vitals:  Blood pressure 122/80, pulse 78, temperature 98 F (36.7 C), temperature source Oral, resp. rate 16, height 5\' 2"  (1.575 m), weight 247 lb 6 oz (112.2 kg), SpO2 100 %.  Physical Exam: WD in NAD Neck  Supple NROM, without any enlargements.  Lymph Node Survey No cervical supraclavicular or inguinal adenopathy Cardiovascular  Pulse normal rate, regularity and rhythm. S1 and S2 normal.  Lungs  Clear to auscultation bilateraly, without wheezes/crackles/rhonchi. Good air movement.  Skin  No rash/lesions/breakdown  Psychiatry  Alert and oriented to person, place, and time  Abdomen  Normoactive bowel sounds, abdomen soft, non-tender and obese without evidence of hernia.  Back No CVA tenderness Genito Urinary  Vulva/vagina: Normal external female genitalia.   No lesions. No discharge or bleeding.  Bladder/urethra:  No lesions or masses,  Vagina: capacious upper vagina, no lesions.  Cervix: Normal appearing, no lesions.  Uterus:  Small, mobile, no parametrial involvement or nodularity.  Adnexa: no palpable masses. Rectal  deferred Extremities  No bilateral cyanosis, clubbing or edema.   Thereasa Solo, MD  07/13/2019, 10:48 AM

## 2019-07-14 NOTE — Progress Notes (Signed)
DUE TO COVID-19 ONLY ONE VISITOR IS ALLOWED TO COME WITH YOU AND STAY IN THE WAITING ROOM ONLY DURING PRE OP AND PROCEDURE DAY OF SURGERY. THE 1 VISITOR MAY VISIT WITH YOU AFTER SURGERY IN YOUR PRIVATE ROOM DURING VISITING HOURS ONLY!  YOU NEED TO HAVE A COVID 19 TEST ON_______ @_______ , THIS TEST MUST BE DONE BEFORE SURGERY, COME  [address], [place] , [id number].  ([place]) ONCE YOUR COVID TEST IS COMPLETED, PLEASE BEGIN THE QUARANTINE INSTRUCTIONS AS OUTLINED IN YOUR HANDOUT.                ARLYCE WOLLARD  07/14/2019   Your procedure is scheduled on:  07/29/19   Report to [facility] Main  Entrance   Report to admitting at     12 noon      Call this number if you have problems the morning of surgery [phone]    Remember: Do not eat food   :After Midnight. BRUSH YOUR TEETH MORNING OF SURGERY AND RINSE YOUR MOUTH OUT, NO CHEWING GUM CANDY OR MINTS. May have clear liquids from 12 midnite until 1100am morning of surgery then nothing by mouth.      Take these medicines the morning of surgery with A SIP OF WATER:  allopurinol                                You may not have any metal on your body including hair pins and              piercings  Do not wear jewelry, make-up, lotions, powders or perfumes, deodorant             Do not wear nail polish on your fingernails.  Do not shave  48 hours prior to surgery.                Do not bring valuables to the hospital. [place].  Contacts, dentures or bridgework may not be worn into surgery.  Leave suitcase in the car. After surgery it may be brought to your room.     Patients discharged the day of surgery will not be allowed to drive home. IF YOU ARE HAVING SURGERY AND GOING HOME THE SAME DAY, YOU MUST HAVE AN ADULT TO DRIVE YOU HOME AND BE WITH YOU FOR 24 HOURS. YOU MAY GO HOME BY TAXI OR UBER OR ORTHERWISE, BUT AN ADULT MUST ACCOMPANY YOU HOME AND  STAY WITH YOU FOR 24 HOURS.  Name and phone number of your driver:                Please read over the following fact sheets you were given: _____________________________________________________________________                CLEAR LIQUID DIET   Foods Allowed                                                                     Foods Excluded  Coffee and tea, regular and decaf  liquids that you cannot  Plain Jell-O any favor except red or purple                                           see through such as: Fruit ices (not with fruit pulp)                                     milk, soups, orange juice  Iced Popsicles                                    All solid food Carbonated beverages, regular and diet                                    Cranberry, grape and apple juices Sports drinks like Gatorade Lightly seasoned clear broth or consume(fat free) Sugar, honey syrup  Sample Menu Breakfast                                Lunch                                     Supper Cranberry juice                    Beef broth                            Chicken broth Jell-O                                     Grape juice                           Apple juice Coffee or tea                        Jell-O                                      Popsicle                                                Coffee or tea                        Coffee or tea  _____________________________________________________________________  [facility] - Preparing for Surgery Before surgery, you can play an important role.  Because skin is not sterile, your skin needs to be as free of germs as possible.  You can reduce the number of germs on your skin by washing with CHG (chlorahexidine gluconate) soap before surgery.  CHG is an antiseptic cleaner which kills  germs and bonds with the skin to continue killing germs even after washing. Please DO NOT use if you have an allergy to CHG or  antibacterial soaps.  If your skin becomes reddened/irritated stop using the CHG and inform your nurse when you arrive at Short Stay. Do not shave (including legs and underarms) for at least 48 hours prior to the first CHG shower.  You may shave your face/neck. Please follow these instructions carefully:  1.  Shower with CHG Soap the night before surgery and the  morning of Surgery.  2.  If you choose to wash your hair, wash your hair first as usual with your  normal  shampoo.  3.  After you shampoo, rinse your hair and body thoroughly to remove the  shampoo.                           4.  Use CHG as you would any other liquid soap.  You can apply chg directly  to the skin and wash                       Gently with a scrungie or clean washcloth.  5.  Apply the CHG Soap to your body ONLY FROM THE NECK DOWN.   Do not use on face/ open                           Wound or open sores. Avoid contact with eyes, ears mouth and genitals (private parts).                       Wash face,  Genitals (private parts) with your normal soap.             6.  Wash thoroughly, paying special attention to the area where your surgery  will be performed.  7.  Thoroughly rinse your body with warm water from the neck down.  8.  DO NOT shower/wash with your normal soap after using and rinsing off  the CHG Soap.                9.  Pat yourself dry with a clean towel.            10.  Wear clean pajamas.            11.  Place clean sheets on your bed the night of your first shower and do not  sleep with pets. Day of Surgery : Do not apply any lotions/deodorants the morning of surgery.  Please wear clean clothes to the hospital/surgery center.  FAILURE TO FOLLOW THESE INSTRUCTIONS MAY RESULT IN THE CANCELLATION OF YOUR SURGERY PATIENT SIGNATURE_________________________________  NURSE SIGNATURE__________________________________  ________________________________________________________________________   Adam Phenix  An incentive spirometer is a tool that can help keep your lungs clear and active. This tool measures how well you are filling your lungs with each breath. Taking long deep breaths may help reverse or decrease the chance of developing breathing (pulmonary) problems (especially infection) following:  A long period of time when you are unable to move or be active. BEFORE THE PROCEDURE   If the spirometer includes an indicator to show your best effort, your nurse or respiratory therapist will set it to a desired goal.  If possible, sit up straight or lean slightly forward. Try not to slouch.  Hold the incentive spirometer in an  upright position. INSTRUCTIONS FOR USE  1. Sit on the edge of your bed if possible, or sit up as far as you can in bed or on a chair. 2. Hold the incentive spirometer in an upright position. 3. Breathe out normally. 4. Place the mouthpiece in your mouth and seal your lips tightly around it. 5. Breathe in slowly and as deeply as possible, raising the piston or the ball toward the top of the column. 6. Hold your breath for 3-5 seconds or for as long as possible. Allow the piston or ball to fall to the bottom of the column. 7. Remove the mouthpiece from your mouth and breathe out normally. 8. Rest for a few seconds and repeat Steps 1 through 7 at least 10 times every 1-2 hours when you are awake. Take your time and take a few normal breaths between deep breaths. 9. The spirometer may include an indicator to show your best effort. Use the indicator as a goal to work toward during each repetition. 10. After each set of 10 deep breaths, practice coughing to be sure your lungs are clear. If you have an incision (the cut made at the time of surgery), support your incision when coughing by placing a pillow or rolled up towels firmly against it. Once you are able to get out of bed, walk around indoors and cough well. You may stop using the incentive spirometer when  instructed by your caregiver.  RISKS AND COMPLICATIONS  Take your time so you do not get dizzy or light-headed.  If you are in pain, you may need to take or ask for pain medication before doing incentive spirometry. It is harder to take a deep breath if you are having pain. AFTER USE  Rest and breathe slowly and easily.  It can be helpful to keep track of a log of your progress. Your caregiver can provide you with a simple table to help with this. If you are using the spirometer at home, follow these instructions: [place] IF:   You are having difficultly using the spirometer.  You have trouble using the spirometer as often as instructed.  Your pain medication is not giving enough relief while using the spirometer.  You develop fever of 100.5 F (38.1 C) or higher. SEEK IMMEDIATE MEDICAL CARE IF:   You cough up bloody sputum that had not been present before.  You develop fever of 102 F (38.9 C) or greater.  You develop worsening pain at or near the incision site. MAKE SURE YOU:   Understand these instructions.  Will watch your condition.  Will get help right away if you are not doing well or get worse. Document Released: 06/17/2006 Document Revised: 04/29/2011 Document Reviewed: 08/18/2006 Exit[employer] Patient Information 2014 Exit[employer], [place].   ________________________________________________________________________  WHAT IS A BLOOD TRANSFUSION? Blood Transfusion Information  A transfusion is the replacement of blood or some of its parts. Blood is made up of multiple cells which provide different functions.  Red blood cells carry oxygen and are used for blood loss replacement.  White blood cells fight against infection.  Platelets control bleeding.  Plasma helps clot blood.  Other blood products are available for specialized needs, such as hemophilia or other clotting disorders. BEFORE THE TRANSFUSION  Who gives blood for transfusions?   Healthy  volunteers who are fully evaluated to make sure their blood is safe. This is blood bank blood. Transfusion therapy is the safest it has ever been in the practice of medicine.  Before blood is taken from a donor, a complete history is taken to make sure that person has no history of diseases nor engages in risky social behavior (examples are intravenous drug use or sexual activity with multiple partners). The donor's travel history is screened to minimize risk of transmitting infections, such as malaria. The donated blood is tested for signs of infectious diseases, such as HIV and hepatitis. The blood is then tested to be sure it is compatible with you in order to minimize the chance of a transfusion reaction. If you or a relative donates blood, this is often done in anticipation of surgery and is not appropriate for emergency situations. It takes many days to process the donated blood. RISKS AND COMPLICATIONS Although transfusion therapy is very safe and saves many lives, the main dangers of transfusion include:   Getting an infectious disease.  Developing a transfusion reaction. This is an allergic reaction to something in the blood you were given. Every precaution is taken to prevent this. The decision to have a blood transfusion has been considered carefully by your caregiver before blood is given. Blood is not given unless the benefits outweigh the risks. AFTER THE TRANSFUSION  Right after receiving a blood transfusion, you will usually feel much better and more energetic. This is especially true if your red blood cells have gotten low (anemic). The transfusion raises the level of the red blood cells which carry oxygen, and this usually causes an energy increase.  The nurse administering the transfusion will monitor you carefully for complications. HOME CARE INSTRUCTIONS  No special instructions are needed after a transfusion. You may find your energy is better. Speak with your caregiver about any  limitations on activity for underlying diseases you may have. SEEK MEDICAL CARE IF:   Your condition is not improving after your transfusion.  You develop redness or irritation at the intravenous (IV) site. SEEK IMMEDIATE MEDICAL CARE IF:  Any of the following symptoms occur over the next 12 hours:  Shaking chills.  You have a temperature by mouth above 102 F (38.9 C), not controlled by medicine.  Chest, back, or muscle pain.  People around you feel you are not acting correctly or are confused.  Shortness of breath or difficulty breathing.  Dizziness and fainting.  You get a rash or develop hives.  You have a decrease in urine output.  Your urine turns a dark color or changes to pink, red, or brown. Any of the following symptoms occur over the next 10 days:  You have a temperature by mouth above 102 F (38.9 C), not controlled by medicine.  Shortness of breath.  Weakness after normal activity.  The white part of the eye turns yellow (jaundice).  You have a decrease in the amount of urine or are urinating less often.  Your urine turns a dark color or changes to pink, red, or brown. Document Released: 02/02/2000 Document Revised: 04/29/2011 Document Reviewed: 09/21/2007 [facility] Patient Information 2014 [place], [place].  _______________________________________________________________________

## 2019-07-15 ENCOUNTER — Encounter (HOSPITAL_COMMUNITY)
Admission: RE | Admit: 2019-07-15 | Discharge: 2019-07-15 | Disposition: A | Discharge status: Home / Self Care | Payer: BC Managed Care – PPO | Source: Ambulatory Visit | Attending: Gynecologic Oncology | Admitting: Gynecologic Oncology

## 2019-07-15 ENCOUNTER — Encounter (HOSPITAL_COMMUNITY): Payer: Self-pay

## 2019-07-15 ENCOUNTER — Other Ambulatory Visit: Payer: Self-pay

## 2019-07-20 ENCOUNTER — Encounter (HOSPITAL_COMMUNITY)
Admission: RE | Admit: 2019-07-20 | Discharge: 2019-07-20 | Disposition: A | Discharge status: Home / Self Care | Payer: BC Managed Care – PPO | Source: Ambulatory Visit | Attending: Gynecologic Oncology | Admitting: Gynecologic Oncology

## 2019-07-20 ENCOUNTER — Telehealth: Payer: Self-pay | Admitting: *Deleted

## 2019-07-20 ENCOUNTER — Other Ambulatory Visit: Payer: Self-pay

## 2019-07-20 DIAGNOSIS — Z01812 Encounter for preprocedural laboratory examination: Secondary | ICD-10-CM | POA: Diagnosis not present

## 2019-07-20 LAB — BASIC METABOLIC PANEL
Anion gap: 9 (ref 5–15)
BUN: 20 mg/dL (ref 8–23)
CO2: 27 mmol/L (ref 22–32)
Calcium: 9.1 mg/dL (ref 8.9–10.3)
Chloride: 106 mmol/L (ref 98–111)
Creatinine, Ser: 0.84 mg/dL (ref 0.44–1.00)
GFR calc Af Amer: >60 mL/min (ref >60)
GFR calc non Af Amer: >60 mL/min (ref >60)
Glucose, Bld: 87 mg/dL (ref 70–99)
Potassium: 3.6 mmol/L (ref 3.5–5.1)
Sodium: 142 mmol/L (ref 135–145)

## 2019-07-20 LAB — URINALYSIS, ROUTINE W REFLEX MICROSCOPIC
Bilirubin Urine: NEGATIVE
Glucose, UA: NEGATIVE mg/dL
Hgb urine dipstick: NEGATIVE
Ketones, ur: NEGATIVE mg/dL
Leukocytes,Ua: NEGATIVE
Nitrite: NEGATIVE
Protein, ur: NEGATIVE mg/dL
Specific Gravity, Urine: 1.013 (ref 1.005–1.030)
pH: 7 (ref 5.0–8.0)

## 2019-07-20 LAB — CBC
HCT: 40.4 % (ref 36.0–46.0)
Hemoglobin: 13.4 g/dL (ref 12.0–15.0)
MCH: 29.2 pg (ref 26.0–34.0)
MCHC: 33.2 g/dL (ref 30.0–36.0)
MCV: 88 fL (ref 80.0–100.0)
Platelets: 240 10*3/uL (ref 150–400)
RBC: 4.59 MIL/uL (ref 3.87–5.11)
RDW: 14.7 % (ref 11.5–15.5)
WBC: 4.6 10*3/uL (ref 4.0–10.5)
nRBC: 0 % (ref 0.0–0.2)

## 2019-07-20 LAB — ABO/RH: ABO/RH(D): A POS

## 2019-07-20 NOTE — Telephone Encounter (Signed)
Patient requested a copy of the last office note for her cancer policy. Printed a copy and patient to pick up at the front desk

## 2019-07-26 ENCOUNTER — Other Ambulatory Visit (HOSPITAL_COMMUNITY): Payer: BC Managed Care – PPO

## 2019-07-26 ENCOUNTER — Other Ambulatory Visit (HOSPITAL_COMMUNITY)
Admission: RE | Admit: 2019-07-26 | Discharge: 2019-07-26 | Disposition: A | Discharge status: Home / Self Care | Payer: BC Managed Care – PPO | Source: Ambulatory Visit | Attending: Gynecologic Oncology | Admitting: Gynecologic Oncology

## 2019-07-26 DIAGNOSIS — Z20822 Contact with and (suspected) exposure to covid-19: Secondary | ICD-10-CM | POA: Insufficient documentation

## 2019-07-26 DIAGNOSIS — Z01812 Encounter for preprocedural laboratory examination: Secondary | ICD-10-CM | POA: Diagnosis not present

## 2019-07-26 LAB — SARS CORONAVIRUS 2 (TAT 6-24 HRS): SARS Coronavirus 2: NEGATIVE

## 2019-07-27 ENCOUNTER — Telehealth: Payer: Self-pay

## 2019-07-27 NOTE — Telephone Encounter (Signed)
Told Andrea Eaton that her surgery was moved up to 1030 on 07-29-19 due to a cancellation of an earlier case that day. She needs to arrive at admitting on 07-29-19 at 0830. She can have clear liquids until 0730. Reviewed her written instructions and she stated that she had no questions at this time.She can call th office at [phone] if she has any questions prior to Thursday.

## 2019-07-29 ENCOUNTER — Encounter (HOSPITAL_COMMUNITY): Payer: Self-pay | Admitting: Gynecologic Oncology

## 2019-07-29 ENCOUNTER — Other Ambulatory Visit: Payer: Self-pay

## 2019-07-29 ENCOUNTER — Ambulatory Visit (HOSPITAL_COMMUNITY): Payer: BC Managed Care – PPO | Admitting: Certified Registered Nurse Anesthetist

## 2019-07-29 ENCOUNTER — Ambulatory Visit (HOSPITAL_COMMUNITY)
Admission: RE | Admit: 2019-07-29 | Discharge: 2019-07-29 | Disposition: A | Discharge status: Home / Self Care | Payer: BC Managed Care – PPO | Source: Ambulatory Visit | Attending: Gynecologic Oncology | Admitting: Gynecologic Oncology

## 2019-07-29 ENCOUNTER — Encounter (HOSPITAL_COMMUNITY)
Admission: RE | Disposition: A | Discharge status: Home / Self Care | Payer: Self-pay | Source: Ambulatory Visit | Attending: Gynecologic Oncology

## 2019-07-29 DIAGNOSIS — M1990 Unspecified osteoarthritis, unspecified site: Secondary | ICD-10-CM | POA: Diagnosis not present

## 2019-07-29 DIAGNOSIS — I10 Essential (primary) hypertension: Secondary | ICD-10-CM | POA: Insufficient documentation

## 2019-07-29 DIAGNOSIS — C541 Malignant neoplasm of endometrium: Secondary | ICD-10-CM | POA: Diagnosis present

## 2019-07-29 DIAGNOSIS — Z6841 Body Mass Index (BMI) 40.0 and over, adult: Secondary | ICD-10-CM | POA: Diagnosis not present

## 2019-07-29 DIAGNOSIS — R7303 Prediabetes: Secondary | ICD-10-CM | POA: Insufficient documentation

## 2019-07-29 DIAGNOSIS — M109 Gout, unspecified: Secondary | ICD-10-CM | POA: Insufficient documentation

## 2019-07-29 DIAGNOSIS — Z79899 Other long term (current) drug therapy: Secondary | ICD-10-CM | POA: Diagnosis not present

## 2019-07-29 HISTORY — PX: ROBOTIC ASSISTED TOTAL HYSTERECTOMY WITH BILATERAL SALPINGO OOPHERECTOMY: SHX6086

## 2019-07-29 HISTORY — PX: SENTINEL NODE BIOPSY: SHX6608

## 2019-07-29 LAB — GLUCOSE, CAPILLARY: Glucose-Capillary: 95 mg/dL (ref 70–99)

## 2019-07-29 LAB — TYPE AND SCREEN
ABO/RH(D): A POS
Antibody Screen: NEGATIVE

## 2019-07-29 SURGERY — HYSTERECTOMY, TOTAL, ROBOT-ASSISTED, LAPAROSCOPIC, WITH BILATERAL SALPINGO-OOPHORECTOMY
Anesthesia: General

## 2019-07-29 MED ORDER — GABAPENTIN 100 MG PO CAPS
200.0000 mg | ORAL_CAPSULE | ORAL | Status: AC
  Administered 2019-07-29: 200 mg via ORAL
  Filled 2019-07-29: qty 2

## 2019-07-29 MED ORDER — DEXAMETHASONE SODIUM PHOSPHATE 10 MG/ML IJ SOLN
INTRAMUSCULAR | Status: AC
  Filled 2019-07-29: qty 1

## 2019-07-29 MED ORDER — ACETAMINOPHEN 650 MG RE SUPP
650.0000 mg | RECTAL | Status: DC | PRN
  Filled 2019-07-29: qty 1

## 2019-07-29 MED ORDER — SCOPOLAMINE 1 MG/3DAYS TD PT72
1.0000 | MEDICATED_PATCH | TRANSDERMAL | Status: DC
  Administered 2019-07-29: 1.5 mg via TRANSDERMAL
  Filled 2019-07-29: qty 1

## 2019-07-29 MED ORDER — DEXAMETHASONE SODIUM PHOSPHATE 10 MG/ML IJ SOLN
INTRAMUSCULAR | Status: DC | PRN
  Administered 2019-07-29: 10 mg via INTRAVENOUS

## 2019-07-29 MED ORDER — PHENYLEPHRINE HCL (PRESSORS) 10 MG/ML IV SOLN
INTRAVENOUS | Status: AC
  Filled 2019-07-29: qty 1

## 2019-07-29 MED ORDER — GLYCOPYRROLATE PF 0.2 MG/ML IJ SOSY
PREFILLED_SYRINGE | INTRAMUSCULAR | Status: DC | PRN
  Administered 2019-07-29: .2 mg via INTRAVENOUS

## 2019-07-29 MED ORDER — SODIUM CHLORIDE 0.9 % IV SOLN: 250.0000 mL | INTRAVENOUS | Status: DC | PRN

## 2019-07-29 MED ORDER — ROCURONIUM BROMIDE 10 MG/ML (PF) SYRINGE
PREFILLED_SYRINGE | INTRAVENOUS | Status: AC
  Filled 2019-07-29: qty 10

## 2019-07-29 MED ORDER — MEPERIDINE HCL 50 MG/ML IJ SOLN: 6.2500 mg | INTRAMUSCULAR | Status: DC | PRN

## 2019-07-29 MED ORDER — ACETAMINOPHEN 325 MG PO TABS: 650.0000 mg | ORAL_TABLET | ORAL | Status: DC | PRN

## 2019-07-29 MED ORDER — FENTANYL CITRATE (PF) 250 MCG/5ML IJ SOLN
INTRAMUSCULAR | Status: DC | PRN
  Administered 2019-07-29: 100 ug via INTRAVENOUS

## 2019-07-29 MED ORDER — HYDROMORPHONE HCL 1 MG/ML IJ SOLN
INTRAMUSCULAR | Status: AC
  Administered 2019-07-29: 0.5 mg via INTRAVENOUS
  Filled 2019-07-29: qty 1

## 2019-07-29 MED ORDER — DEXAMETHASONE SODIUM PHOSPHATE 4 MG/ML IJ SOLN: 4.0000 mg | INTRAMUSCULAR | Status: DC

## 2019-07-29 MED ORDER — MIDAZOLAM HCL 5 MG/5ML IJ SOLN
INTRAMUSCULAR | Status: DC | PRN
  Administered 2019-07-29: 2 mg via INTRAVENOUS

## 2019-07-29 MED ORDER — KETOROLAC TROMETHAMINE 30 MG/ML IJ SOLN
30.0000 mg | Freq: Once | INTRAMUSCULAR | Status: AC | PRN
  Administered 2019-07-29: 30 mg via INTRAVENOUS

## 2019-07-29 MED ORDER — LACTATED RINGERS IV SOLN: INTRAVENOUS | Status: DC

## 2019-07-29 MED ORDER — SODIUM CHLORIDE 0.9% FLUSH: 3.0000 mL | INTRAVENOUS | Status: DC | PRN

## 2019-07-29 MED ORDER — PROPOFOL 10 MG/ML IV BOLUS
INTRAVENOUS | Status: DC | PRN
  Administered 2019-07-29: 200 mg via INTRAVENOUS

## 2019-07-29 MED ORDER — ACETAMINOPHEN 500 MG PO TABS
1000.0000 mg | ORAL_TABLET | ORAL | Status: AC
  Administered 2019-07-29: 1000 mg via ORAL
  Filled 2019-07-29: qty 2

## 2019-07-29 MED ORDER — ROCURONIUM BROMIDE 10 MG/ML (PF) SYRINGE
PREFILLED_SYRINGE | INTRAVENOUS | Status: DC | PRN
  Administered 2019-07-29: 60 mg via INTRAVENOUS
  Administered 2019-07-29: 40 mg via INTRAVENOUS

## 2019-07-29 MED ORDER — MORPHINE SULFATE (PF) 4 MG/ML IV SOLN: 2.0000 mg | INTRAVENOUS | Status: DC | PRN

## 2019-07-29 MED ORDER — PROPOFOL 10 MG/ML IV BOLUS
INTRAVENOUS | Status: AC
  Filled 2019-07-29: qty 20

## 2019-07-29 MED ORDER — SUGAMMADEX SODIUM 500 MG/5ML IV SOLN
INTRAVENOUS | Status: DC | PRN
  Administered 2019-07-29: 500 mg via INTRAVENOUS

## 2019-07-29 MED ORDER — FENTANYL CITRATE (PF) 100 MCG/2ML IJ SOLN
INTRAMUSCULAR | Status: AC
  Filled 2019-07-29: qty 2

## 2019-07-29 MED ORDER — ONDANSETRON HCL 4 MG/2ML IJ SOLN
INTRAMUSCULAR | Status: DC | PRN
  Administered 2019-07-29: 4 mg via INTRAVENOUS

## 2019-07-29 MED ORDER — ENOXAPARIN SODIUM 40 MG/0.4ML ~~LOC~~ SOLN
40.0000 mg | SUBCUTANEOUS | Status: AC
  Administered 2019-07-29: 40 mg via SUBCUTANEOUS
  Filled 2019-07-29: qty 0.4

## 2019-07-29 MED ORDER — OXYCODONE HCL 5 MG PO TABS: 5.0000 mg | ORAL_TABLET | ORAL | Status: DC | PRN

## 2019-07-29 MED ORDER — PROMETHAZINE HCL 25 MG/ML IJ SOLN: 6.2500 mg | INTRAMUSCULAR | Status: DC | PRN

## 2019-07-29 MED ORDER — LIDOCAINE HCL 2 % IJ SOLN
INTRAMUSCULAR | Status: AC
  Filled 2019-07-29: qty 20

## 2019-07-29 MED ORDER — LACTATED RINGERS IV SOLN: INTRAVENOUS | Status: DC | PRN

## 2019-07-29 MED ORDER — HYDROMORPHONE HCL 1 MG/ML IJ SOLN
0.2500 mg | INTRAMUSCULAR | Status: DC | PRN
  Administered 2019-07-29: 0.5 mg via INTRAVENOUS

## 2019-07-29 MED ORDER — MIDAZOLAM HCL 2 MG/2ML IJ SOLN
INTRAMUSCULAR | Status: AC
  Filled 2019-07-29: qty 2

## 2019-07-29 MED ORDER — CELECOXIB 200 MG PO CAPS
200.0000 mg | ORAL_CAPSULE | ORAL | Status: AC
  Administered 2019-07-29: 200 mg via ORAL
  Filled 2019-07-29: qty 1

## 2019-07-29 MED ORDER — KETOROLAC TROMETHAMINE 30 MG/ML IJ SOLN
INTRAMUSCULAR | Status: AC
  Filled 2019-07-29: qty 1

## 2019-07-29 MED ORDER — CEFAZOLIN SODIUM-DEXTROSE 2-4 GM/100ML-% IV SOLN
2.0000 g | INTRAVENOUS | Status: AC
  Administered 2019-07-29: 2 g via INTRAVENOUS
  Filled 2019-07-29: qty 100

## 2019-07-29 MED ORDER — CHLORHEXIDINE GLUCONATE 0.12 % MT SOLN
15.0000 mL | Freq: Once | OROMUCOSAL | Status: AC
  Administered 2019-07-29: 15 mL via OROMUCOSAL

## 2019-07-29 MED ORDER — LACTATED RINGERS IR SOLN
Status: DC | PRN
  Administered 2019-07-29: 1000 mL

## 2019-07-29 MED ORDER — ONDANSETRON HCL 4 MG/2ML IJ SOLN
INTRAMUSCULAR | Status: AC
  Filled 2019-07-29: qty 2

## 2019-07-29 MED ORDER — BUPIVACAINE HCL 0.25 % IJ SOLN
INTRAMUSCULAR | Status: DC | PRN
  Administered 2019-07-29: 20 mL

## 2019-07-29 MED ORDER — LIDOCAINE 2% (20 MG/ML) 5 ML SYRINGE
INTRAMUSCULAR | Status: AC
  Filled 2019-07-29: qty 5

## 2019-07-29 MED ORDER — ORAL CARE MOUTH RINSE: 15.0000 mL | Freq: Once | OROMUCOSAL | Status: AC

## 2019-07-29 MED ORDER — PHENYLEPHRINE HCL-NACL 10-0.9 MG/250ML-% IV SOLN
INTRAVENOUS | Status: DC | PRN
  Administered 2019-07-29: 25 ug/min via INTRAVENOUS

## 2019-07-29 MED ORDER — LIDOCAINE 20MG/ML (2%) 15 ML SYRINGE OPTIME
INTRAMUSCULAR | Status: DC | PRN
  Administered 2019-07-29: 1 mg/kg/h via INTRAVENOUS

## 2019-07-29 MED ORDER — SODIUM CHLORIDE 0.9% FLUSH: 3.0000 mL | Freq: Two times a day (BID) | INTRAVENOUS | Status: DC

## 2019-07-29 MED ORDER — KETAMINE HCL 10 MG/ML IJ SOLN
INTRAMUSCULAR | Status: DC | PRN
  Administered 2019-07-29: 50 mg via INTRAVENOUS

## 2019-07-29 MED ORDER — LIDOCAINE 2% (20 MG/ML) 5 ML SYRINGE
INTRAMUSCULAR | Status: DC | PRN
  Administered 2019-07-29: 100 mg via INTRAVENOUS

## 2019-07-29 MED ORDER — PHENYLEPHRINE 40 MCG/ML (10ML) SYRINGE FOR IV PUSH (FOR BLOOD PRESSURE SUPPORT)
PREFILLED_SYRINGE | INTRAVENOUS | Status: AC
  Filled 2019-07-29: qty 60

## 2019-07-29 SURGICAL SUPPLY — 46 items
ADH SKN CLS APL DERMABOND .7 (GAUZE/BANDAGES/DRESSINGS) ×2
BACTOSHIELD CHG 4% 4OZ (MISCELLANEOUS) ×1
COVER BACK TABLE 60X90IN (DRAPES) ×3 IMPLANT
COVER TIP SHEARS 8 DVNC (MISCELLANEOUS) ×2 IMPLANT
COVER TIP SHEARS 8MM DA VINCI (MISCELLANEOUS) ×3
DECANTER SPIKE VIAL GLASS SM (MISCELLANEOUS) ×1 IMPLANT
DERMABOND ADVANCED (GAUZE/BANDAGES/DRESSINGS) ×1
DERMABOND ADVANCED .7 DNX12 (GAUZE/BANDAGES/DRESSINGS) ×2 IMPLANT
DRAPE ARM DVNC X/XI (DISPOSABLE) ×8 IMPLANT
DRAPE COLUMN DVNC XI (DISPOSABLE) ×2 IMPLANT
DRAPE DA VINCI XI ARM (DISPOSABLE) ×12
DRAPE DA VINCI XI COLUMN (DISPOSABLE) ×3
DRAPE SHEET LG 3/4 BI-LAMINATE (DRAPES) ×3 IMPLANT
DRAPE SURG IRRIG POUCH 19X23 (DRAPES) ×3 IMPLANT
ELECT REM PT RETURN 15FT ADLT (MISCELLANEOUS) ×3 IMPLANT
GLOVE BIO SURGEON STRL SZ 6 (GLOVE) ×12 IMPLANT
GLOVE BIO SURGEON STRL SZ 6.5 (GLOVE) ×6 IMPLANT
GOWN STRL REUS W/ TWL LRG LVL3 (GOWN DISPOSABLE) ×8 IMPLANT
GOWN STRL REUS W/TWL LRG LVL3 (GOWN DISPOSABLE) ×12
IRRIG SUCT STRYKERFLOW 2 WTIP (MISCELLANEOUS) ×3
IRRIGATION SUCT STRKRFLW 2 WTP (MISCELLANEOUS) ×2 IMPLANT
KIT PROCEDURE DA VINCI SI (MISCELLANEOUS) ×3
KIT PROCEDURE DVNC SI (MISCELLANEOUS) IMPLANT
KIT TURNOVER KIT A (KITS) ×1 IMPLANT
MANIPULATOR UTERINE 4.5 ZUMI (MISCELLANEOUS) ×3 IMPLANT
NDL SPNL 18GX3.5 QUINCKE PK (NEEDLE) IMPLANT
NEEDLE HYPO 22GX1.5 SAFETY (NEEDLE) ×3 IMPLANT
NEEDLE SPNL 18GX3.5 QUINCKE PK (NEEDLE) ×3 IMPLANT
OBTURATOR OPTICAL STANDARD 8MM (TROCAR) ×3
OBTURATOR OPTICAL STND 8 DVNC (TROCAR) ×2
OBTURATOR OPTICALSTD 8 DVNC (TROCAR) ×2 IMPLANT
PACK ROBOT GYN WLCUSTOM (TRAY / TRAY PROCEDURE) ×3 IMPLANT
PAD POSITIONING PINK XL (MISCELLANEOUS) ×3 IMPLANT
PORT ACCESS TROCAR AIRSEAL 12 (TROCAR) ×2 IMPLANT
PORT ACCESS TROCAR AIRSEAL 5M (TROCAR) ×1
SCRUB CHG 4% DYNA-HEX 4OZ (MISCELLANEOUS) ×2 IMPLANT
SEAL CANN UNIV 5-8 DVNC XI (MISCELLANEOUS) ×6 IMPLANT
SEAL XI 5MM-8MM UNIVERSAL (MISCELLANEOUS) ×12
SET TRI-LUMEN FLTR TB AIRSEAL (TUBING) ×3 IMPLANT
SUT VIC AB 0 CT1 27 (SUTURE) ×3
SUT VIC AB 0 CT1 27XBRD ANTBC (SUTURE) IMPLANT
SUT VICRYL 4-0 PS2 18IN ABS (SUTURE) ×6 IMPLANT
TOWEL OR NON WOVEN STRL DISP B (DISPOSABLE) ×3 IMPLANT
TRAY FOLEY MTR SLVR 16FR STAT (SET/KITS/TRAYS/PACK) ×3 IMPLANT
UNDERPAD 30X36 HEAVY ABSORB (UNDERPADS AND DIAPERS) ×3 IMPLANT
WATER STERILE IRR 1000ML POUR (IV SOLUTION) ×3 IMPLANT

## 2019-07-29 NOTE — Op Note (Signed)
OPERATIVE NOTE 07/29/19  Surgeon: Donaciano Eva   Assistants: Dr Lahoma Crocker (an MD assistant was necessary for tissue manipulation, management of robotic instrumentation, retraction and positioning due to the complexity of the case and hospital policies).   Anesthesia: General endotracheal anesthesia  ASA Class: 3   Pre-operative Diagnosis: endometrial cancer  Post-operative Diagnosis: same  Operation: Robotic-assisted laparoscopic total hysterectomy with bilateral salpingoophorectomy   Surgeon: Donaciano Eva  Assistant Surgeon: Lahoma Crocker MD  Anesthesia: GET  Urine Output: 100cc  Operative Findings:  : 7cm uterus with anterior fundal fibroid. Normal ovaries bilaterally, bilateral mapping, no gross extra uterine disease.   Estimated Blood Loss:  60mL      Total IV Fluids: 800 ml         Specimens: uterus with cervix and bilateral tubes and ovaries with left and right external iliac SLN's         Complications:  None; patient tolerated the procedure well.         Disposition: PACU - hemodynamically stable.  Procedure Details  The patient was seen in the Holding Room. The risks, benefits, complications, treatment options, and expected outcomes were discussed with the patient.  The patient concurred with the proposed plan, giving informed consent.  The site of surgery properly noted/marked. The patient was identified as Andrea Eaton and the procedure verified as a Robotic-assisted hysterectomy with bilateral salpingo oophorectomy. A Time Out was held and the above information confirmed.  After induction of anesthesia, the patient was draped and prepped in the usual sterile manner. Pt was placed in supine position after anesthesia and draped and prepped in the usual sterile manner. The abdominal drape was placed after the CholoraPrep had been allowed to dry for 3 minutes.  Her arms were tucked to her side with all appropriate precautions.  The  shoulders were stabilized with padded shoulder blocks applied to the acromium processes.  The patient was placed in the semi-lithotomy position in [place].  The perineum was prepped with Betadine. The patient was then prepped. Foley catheter was placed.  A sterile speculum was placed in the vagina.  The cervix was grasped with a single-tooth tenaculum and dilated with Kennon Rounds dilators. 1mg  total of ICG was injected into the cervical stroma at 2 and 9 o'clock at a 66mm depth (concentration 0..5mg /ml).  The ZUMI uterine manipulator with a medium colpotomizer ring was placed without difficulty.  A pneum occluder balloon was placed over the manipulator.  OG tube placement was confirmed and to suction.   Next, a 5 mm skin incision was made 1 cm below the subcostal margin in the midclavicular line.  The 5 mm Optiview port and scope was used for direct entry.  Opening pressure was under 10 mm CO2.  The abdomen was insufflated and the findings were noted as above.   At this point and all points during the procedure, the patient's intra-abdominal pressure did not exceed 15 mmHg. Next, a 10 mm skin incision was made in the umbilicus and a right and left port was placed about 10 cm lateral to the robot port on the right and left side.  Due to extreme abdominal adiposity (BMI 45), a fourth arm was placed in the left lower quadrant 2 cm above and superior and medial to the anterior superior iliac spine.  All ports were placed under direct visualization.  The patient was placed in steep Trendelenburg.  Bowel was folded away into the upper abdomen.  The robot was docked  in the normal manner.  The right and left peritoneum were opened parallel to the IP ligament to open the retroperitoneal spaces bilaterally. The SLN mapping was performed in bilateral pelvic basins. The para rectal and paravesical spaces were opened up. Lymphatic channels were identified travelling to the following visualized sentinel lymph node's: right  and left external iliac SLN. These SLN's were separated from their surrounding lymphatic tissue, removed and sent for permanent pathology.  The hysterectomy was started after the round ligament on the right side was incised and the retroperitoneum was entered and the pararectal space was developed.  The ureter was noted to be on the medial leaf of the broad ligament.  The peritoneum above the ureter was incised and stretched and the infundibulopelvic ligament was skeletonized, cauterized and cut.  The posterior peritoneum was taken down to the level of the KOH ring.  The anterior peritoneum was also taken down.  The bladder flap was created to the level of the KOH ring.  The uterine artery on the right side was skeletonized, cauterized and cut in the normal manner.  A similar procedure was performed on the left.  The colpotomy was made and the uterus, cervix, bilateral ovaries and tubes were amputated and delivered through the vagina.  Pedicles were inspected and excellent hemostasis was achieved.    The colpotomy at the vaginal cuff was closed with Vicryl on a CT1 needle in a running manner.  Irrigation was used and excellent hemostasis was achieved.  At this point in the procedure was completed.  Robotic instruments were removed under direct visulaization.  The robot was undocked. The 10 mm ports were closed with Vicryl on a UR-5 needle and the fascia was closed with 0 Vicryl on a UR-5 needle.  The skin was closed with 4-0 Vicryl in a subcuticular manner.  Dermabond was applied.  Sponge, lap and needle counts correct x 2.  The patient was taken to the recovery room in stable condition.  The vagina was swabbed with  minimal bleeding noted.   All instrument and needle counts were correct x  3.   The patient was transferred to the recovery room in stable condition.  Donaciano Eva, MD

## 2019-07-29 NOTE — Anesthesia Procedure Notes (Signed)
Procedure Name: Intubation Date/Time: 07/29/2019 10:51 AM Performed by: Mitzie Na, CRNA Pre-anesthesia Checklist: Patient identified, Emergency Drugs available, Suction available and Patient being monitored Patient Re-evaluated:Patient Re-evaluated prior to induction Oxygen Delivery Method: Circle system utilized Preoxygenation: Pre-oxygenation with 100% oxygen Induction Type: IV induction Ventilation: Mask ventilation without difficulty Laryngoscope Size: Miller and 3 Grade View: Grade II Tube type: Oral Tube size: 7.5 mm Number of attempts: 1 Airway Equipment and Method: Stylet,  Patient positioned with wedge pillow and Oral airway Placement Confirmation: ETT inserted through vocal cords under direct vision,  positive ETCO2,  CO2 detector and breath sounds checked- equal and bilateral Secured at: 22 cm Tube secured with: Tape

## 2019-07-29 NOTE — Interval H&P Note (Signed)
History and Physical Interval Note:  07/29/2019 10:20 AM  Andrea Eaton  has presented today for surgery, with the diagnosis of ENDOMETRIAL CANCER.  The various methods of treatment have been discussed with the patient and family. After consideration of risks, benefits and other options for treatment, the patient has consented to  Procedure(s): XI ROBOTIC ASSISTED TOTAL HYSTERECTOMY WITH BILATERAL SALPINGO OOPHORECTOMY (Bilateral) SENTINEL NODE BIOPSY (N/A) as a surgical intervention.  The patient's history has been reviewed, patient examined, no change in status, stable for surgery.  I have reviewed the patient's chart and labs.  Questions were answered to the patient's satisfaction.     Thereasa Solo

## 2019-07-29 NOTE — Transfer of Care (Signed)
Immediate Anesthesia Transfer of Care Note  Patient: Andrea Eaton  Procedure(s) Performed: XI ROBOTIC ASSISTED TOTAL HYSTERECTOMY WITH BILATERAL SALPINGO OOPHORECTOMY (Bilateral ) SENTINEL NODE BIOPSY (N/A )  Patient Location: PACU  Anesthesia Type:General  Level of Consciousness: awake, alert , oriented, patient cooperative and responds to stimulation  Airway & Oxygen Therapy: Patient Spontanous Breathing and Patient connected to face mask oxygen  Post-op Assessment: Report given to RN, Post -op Vital signs reviewed and stable and Patient moving all extremities  Post vital signs: Reviewed and stable  Last Vitals:  Vitals Value Taken Time  BP 140/63 07/29/19 1247  Temp    Pulse 74 07/29/19 1251  Resp 21 07/29/19 1251  SpO2 100 % 07/29/19 1251  Vitals shown include unvalidated device data.  Last Pain:  Vitals:   07/29/19 1247  TempSrc:   PainSc: (P) Asleep      Patients Stated Pain Goal: 0 (00/37/04 8889)  Complications: No complications documented.

## 2019-07-29 NOTE — Discharge Instructions (Signed)
Return to work: 4 weeks (2 weeks with physical restrictions).  Activity: 1. Be up and out of the bed during the day.  Take a nap if needed.  You may walk up steps but be careful and use the hand rail.  Stair climbing will tire you more than you think, you may need to stop part way and rest.   2. No lifting or straining for 4 weeks.  3. No driving for 1 weeks.  Do Not drive if you are taking narcotic pain medicine.  4. Shower daily.  Use soap and water on your incision and pat dry; don't rub.   5. No sexual activity and nothing in the vagina for 8 weeks.  Medications:  - Take ibuprofen and tylenol first line for pain control. Take these regularly (every 6 hours) to decrease the build up of pain.  - If necessary, for severe pain not relieved by ibuprofen, contact Dr Eaton's office and you will be prescribed percocet.  - While taking percocet you should take sennakot every night to reduce the likelihood of constipation. If this causes diarrhea, stop its use.  Diet: 1. Low sodium Heart Healthy Diet is recommended.  2. It is safe to use a laxative if you have difficulty moving your bowels.   Wound Care: 1. Keep clean and dry.  Shower daily.  Reasons to call the Doctor:   Fever - Oral temperature greater than 100.4 degrees Fahrenheit  Foul-smelling vaginal discharge  Difficulty urinating  Nausea and vomiting  Increased pain at the site of the incision that is unrelieved with pain medicine.  Difficulty breathing with or without chest pain  New calf pain especially if only on one side  Sudden, continuing increased vaginal bleeding with or without clots.   Follow-up: 1. See Andrea Eaton in 4 weeks.  Contacts: For questions or concerns you should contact:  Dr. Emma Eaton at [phone] After hours and on week-ends call [phone] and ask to speak to the physician on call for Gynecologic Oncology  After Your Surgery  The information in this section will tell you what  to expect after your surgery, both during your stay and after you leave. You will learn how to safely recover from your surgery. Write down any questions you have and be sure to ask your doctor or nurse.  What to Expect When you wake up after your surgery, you will be in the Post-Anesthesia Care Unit (PACU) or your recovery room. A nurse will be monitoring your body temperature, blood pressure, pulse, and oxygen levels. You may have a urinary catheter in your bladder to help monitor the amount of urine you are making. It should come out before you go home. You will also have compression boots on your lower legs to help your circulation. Your pain medication will be given through an IV line or in tablet form. If you are having pain, tell your nurse. Your nurse will tell you how to recover from your surgery. Below are examples of ways you can help yourself recover safely. . You will be encouraged to walk with the help of your nurse or physical therapist. We will give you medication to relieve pain. Walking helps reduce the risk for blood clots and pneumonia. It also helps to stimulate your bowels so they begin working again. . Use your incentive spirometer. This will help your lungs expand, which prevents pneumonia.   Commonly Asked Questions  Will I have pain after surgery? Yes, you will have some   pain after your surgery, especially in the first few days. Your doctor and nurse will ask you about your pain often. You will be given medication to manage your pain as needed. If your pain is not relieved, please tell your doctor or nurse. It is important to control your pain so you can cough, breathe deeply, use your incentive spirometer, and get out of bed and walk.  Will I be able to eat? Yes, you will be able to eat a regular diet or eat as tolerated. You should start with foods that are soft and easy to digest such as apple sauce and chicken noodle soup. Eat small meals frequently, and then advance  to regular foods. If you experience bloating, gas, or cramps, limit high-fiber foods, including whole grain breads and cereal, nuts, seeds, salads, fresh fruit, broccoli, cabbage, and cauliflower. Will I have pain when I am home? The length of time each person has pain or discomfort varies. You may still have some pain when you go home and will probably be taking pain medication. Follow the guidelines below. . Take your medications as directed and as needed. . Call your doctor if the medication prescribed for you doesn't relieve your pain. . Don't drive or drink alcohol while you're taking prescription pain medication. . As your incision heals, you will have less pain and need less pain medication. A mild pain reliever such as acetaminophen (Tylenol) or ibuprofen (Advil) will relieve aches and discomfort. However, large quantities of acetaminophen may be harmful to your liver. Don't take more acetaminophen than the amount directed on the bottle or as instructed by your doctor or nurse. . Pain medication should help you as you resume your normal activities. Take enough medication to do your exercises comfortably. Pain medication is most effective 30 to 45 minutes after taking it. . Keep track of when you take your pain medication. Taking it when your pain first begins is more effective than waiting for the pain to get worse. Pain medication may cause constipation (having fewer bowel movements than what is normal for you).  How can I prevent constipation? . Go to the bathroom at the same time every day. Your body will get used to going at that time. . If you feel the urge to go, don't put it off. Try to use the bathroom 5 to 15 minutes after meals. . After breakfast is a good time to move your bowels. The reflexes in your colon are strongest at this time. . Exercise, if you can. Walking is an excellent form of exercise. . Drink 8 (8-ounce) glasses (2 liters) of liquids daily, if you can. Drink  water, juices, soups, ice cream shakes, and other drinks that don't have caffeine. Drinks with caffeine, such as coffee and soda, pull fluid out of the body. . Slowly increase the fiber in your diet to 25 to 35 grams per day. Fruits, vegetables, whole grains, and cereals contain fiber. If you have an ostomy or have had recent bowel surgery, check with your doctor or nurse before making any changes in your diet. . Both over-the-counter and prescription medications are available to treat constipation. Start with 1 of the following over-the-counter medications first: o Docusate sodium (Colace) 100 mg. Take ___1__ capsules _2____ times a day. This is a stool softener that causes few side effects. Don't take it with mineral oil. o Polyethylene glycol (MiraLAX) 17 grams daily. o Senna (Senokot) 2 tablets at bedtime. This is a stimulant laxative, which can cause cramping. .   If you haven't had a bowel movement in 2 days, call your doctor or nurse.  Can I shower? Yes, you should shower 24 hours after your surgery. Be sure to shower every day. Taking a warm shower is relaxing and can help decrease muscle aches. Use soap when you shower and gently wash your incision. Pat the areas dry with a towel after showering, and leave your incision uncovered (unless there is drainage). Call your doctor if you see any redness or drainage from your incision. Don't take tub baths until you discuss it with your doctor at the first appointment after your surgery. How do I care for my incisions? You will have several small incisions on your abdomen. The incisions are closed with Steri-Strips or Dermabond. You may also have square white dressings on your incisions (Primapore). You can remove these in the shower 24 hours after your surgery. You should clean your incisions with soap and water. If you go home with Steri-Strips on your incision, they will loosen and may fall off by themselves. If they haven't fallen off within 10  days, you can remove them. If you go home with Dermabond over your sutures (stitches), it will also loosen and peel off.  What are the most common symptoms after a hysterectomy? It's common for you to have some vaginal spotting or light bleeding. You should monitor this with a pad or a panty liner. If you have having heavy bleeding (bleeding through a pad or liner every 1 to 2 hours), call your doctor right away. It's also common to have some discomfort after surgery from the air that was pumped into your abdomen during surgery. To help with this, walk, drink plenty of liquids and make sure to take the stool softeners you received.  When is it safe for me to drive? You may resume driving 2 weeks after surgery, as long as you are not taking pain medication that may make you drowsy.  When can I resume sexual activity? Do not place anything in your vagina or have vaginal intercourse for 8 weeks after your surgery. Some people will need to wait longer than 8 weeks, so speak with your doctor before resuming sexual intercourse.  Will I be able to travel? Yes, you can travel. If you are traveling by plane within a few weeks after your surgery, make sure you get up and walk every hour. Be sure to stretch your legs, drink plenty of liquids, and keep your feet elevated when possible.  Will I need any supplies? Most people do not need any supplies after the surgery. In the rare case that you do need supplies, such as tubes or drains, your nurse will order them for you.  When can I return to work? The time it takes to return to work depends on the type of work you do, the type of surgery you had, and how fast your body heals. Most people can return to work about 2 to 4 weeks after the surgery.  What exercises can I do? Exercise will help you gain strength and feel better. Walking and stair climbing are excellent forms of exercise. Gradually increase the distance you walk. Climb stairs slowly, resting or  stopping as needed. Ask your doctor or nurse before starting more strenuous exercises.  When can I lift heavy objects? Most people should not lift anything heavier than 10 pounds (4.5 kilograms) for at least 4 weeks after surgery. Speak with your doctor about when you can do heavy lifting.    How can I cope with my feelings? After surgery for a serious illness, you may have new and upsetting feelings. Many people say they felt weepy, sad, worried, nervous, irritable, and angry at one time or another. You may find that you can't control some of these feelings. If this happens, it's a good idea to seek emotional support. The first step in coping is to talk about how you feel. Family and friends can help. Your nurse, doctor, and social worker can reassure, support, and guide you. It's always a good idea to let these professionals know how you, your family, and your friends are feeling emotionally. Many resources are available to patients and their families. Whether you're in the hospital or at home, the nurses, doctors, and social workers are here to help you and your family and friends handle the emotional aspects of your illness.  When is my first appointment after surgery? Your first appointment after surgery will be 2 to 4 weeks after surgery. Your nurse will give you instructions on how to make this appointment, including the phone number to call.  What if I have other questions? If you have any questions or concerns, please talk with your doctor or nurse. You can reach them Monday through Friday from 9:00 am to 5:00 pm. After 5:00 pm, during the weekend, and on holidays, call [phone] and ask for the doctor on call for your doctor.  . Have a temperature of 101 F (38.3 C) or higher . Have pain that does not get better with pain medication . Have redness, drainage, or swelling from your incisions

## 2019-07-29 NOTE — Anesthesia Preprocedure Evaluation (Addendum)
Anesthesia Evaluation  Patient identified by MRN, date of birth, ID band Patient awake    Reviewed: Allergy & Precautions, NPO status , Patient's Chart, lab work & pertinent test results  History of Anesthesia Complications Negative for: history of anesthetic complications  Airway Mallampati: I       Dental  (+) Teeth Intact, Poor Dentition   Pulmonary    Pulmonary exam normal breath sounds clear to auscultation       Cardiovascular hypertension, Pt. on medications and Pt. on home beta blockers Normal cardiovascular exam Rhythm:Regular Rate:Normal     Neuro/Psych Anxiety  Neuromuscular disease    GI/Hepatic negative GI ROS, Neg liver ROS,   Endo/Other  Morbid obesity  Renal/GU negative Renal ROS  negative genitourinary   Musculoskeletal  (+) Arthritis  (gout), Osteoarthritis,    Abdominal (+) + obese,   Peds  Hematology   Anesthesia Other Findings   Reproductive/Obstetrics negative OB ROS                           Anesthesia Physical  Anesthesia Plan  ASA: III  Anesthesia Plan: General   Post-op Pain Management:    Induction: Intravenous  PONV Risk Score and Plan: 4 or greater and Ondansetron, Dexamethasone, Metaclopromide and Midazolam  Airway Management Planned: Oral ETT  Additional Equipment: None  Intra-op Plan:   Post-operative Plan: Extubation in OR  Informed Consent: I have reviewed the patients History and Physical, chart, labs and discussed the procedure including the risks, benefits and alternatives for the proposed anesthesia with the patient or authorized representative who has indicated his/her understanding and acceptance.     Dental advisory given  Plan Discussed with: CRNA  Anesthesia Plan Comments:         Anesthesia Quick Evaluation

## 2019-07-29 NOTE — Anesthesia Procedure Notes (Deleted)
Performed by: Darielle Hancher T, CRNA       

## 2019-07-30 ENCOUNTER — Encounter (HOSPITAL_COMMUNITY): Payer: Self-pay | Admitting: Gynecologic Oncology

## 2019-07-30 ENCOUNTER — Telehealth: Payer: Self-pay

## 2019-07-30 NOTE — Telephone Encounter (Signed)
Ms Galvis states that she is eating, drinking, and urinating well. She has not passed gas.  She did begin the senokot-S 2 tabs last night. Told her to take 2 tables now with 8 oz of water and continue taking it bid. If no BM by tomorrow mid day tomorrow, she would need to add Miralax 1 capful bid.  She she develops N/, she needs to call the office and speak with the after hours nurse as some times the bowel does not wake up and creates an ileus. Afebrile. Incisions are D&I Pt aware of post op appointments and the office number [phone] if any questions or concerns.

## 2019-07-30 NOTE — Anesthesia Postprocedure Evaluation (Signed)
Anesthesia Post Note  Patient: Andrea Eaton  Procedure(s) Performed: XI ROBOTIC ASSISTED TOTAL HYSTERECTOMY WITH BILATERAL SALPINGO OOPHORECTOMY (Bilateral ) SENTINEL NODE BIOPSY (N/A )     Patient location during evaluation: PACU Anesthesia Type: General Level of consciousness: awake and sedated Pain management: pain level controlled Vital Signs Assessment: post-procedure vital signs reviewed and stable Respiratory status: spontaneous breathing Cardiovascular status: stable Postop Assessment: no apparent nausea or vomiting Anesthetic complications: no   No complications documented.  Last Vitals:  Vitals:   07/29/19 1508 07/29/19 1511  BP: (!) 158/97   Pulse: (!) 51   Resp: 15   Temp:  (!) 36.4 C  SpO2: 93%     Last Pain:  Vitals:   07/29/19 1511  TempSrc: Oral  PainSc:                  Huston Foley

## 2019-08-05 ENCOUNTER — Inpatient Hospital Stay: Payer: BC Managed Care – PPO | Attending: Gynecologic Oncology | Admitting: Gynecologic Oncology

## 2019-08-05 ENCOUNTER — Encounter: Payer: Self-pay | Admitting: Gynecologic Oncology

## 2019-08-05 DIAGNOSIS — M1990 Unspecified osteoarthritis, unspecified site: Secondary | ICD-10-CM | POA: Insufficient documentation

## 2019-08-05 DIAGNOSIS — R197 Diarrhea, unspecified: Secondary | ICD-10-CM | POA: Insufficient documentation

## 2019-08-05 DIAGNOSIS — R1030 Lower abdominal pain, unspecified: Secondary | ICD-10-CM | POA: Insufficient documentation

## 2019-08-05 DIAGNOSIS — I10 Essential (primary) hypertension: Secondary | ICD-10-CM | POA: Insufficient documentation

## 2019-08-05 DIAGNOSIS — Z79899 Other long term (current) drug therapy: Secondary | ICD-10-CM | POA: Insufficient documentation

## 2019-08-05 DIAGNOSIS — Z7189 Other specified counseling: Secondary | ICD-10-CM

## 2019-08-05 DIAGNOSIS — C541 Malignant neoplasm of endometrium: Secondary | ICD-10-CM

## 2019-08-05 DIAGNOSIS — Z90710 Acquired absence of both cervix and uterus: Secondary | ICD-10-CM | POA: Insufficient documentation

## 2019-08-05 DIAGNOSIS — Z90722 Acquired absence of ovaries, bilateral: Secondary | ICD-10-CM | POA: Insufficient documentation

## 2019-08-05 NOTE — Progress Notes (Signed)
Gynecologic Oncology Telehealth Note  I connected with Andrea Eaton on 08/05/19 at  3:15 PM EDT by telephone and verified that I am speaking with the correct person using two identifiers.  I discussed the limitations, risks, security and privacy concerns of performing an evaluation and management service by telemedicine and the availability of in-person appointments. I also discussed with the patient that there may be a patient responsible charge related to this service. The patient expressed understanding and agreed to proceed.  Other persons participating in the visit and their role in the encounter: none.  Patient's location: home Provider's location: [place]  Chief Complaint:  Chief Complaint  Patient presents with  . endometrial cancer    counseling and coordination of care    Assessment/Plan:  [name]  is a 64 y.o.  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 S/p robotic staging on 07/29/19. Pathology revealed low risk factors for recurrence, therefore no adjuvant therapy is recommended according to NCCN guidelines.  I discussed risk for recurrence and typical symptoms encouraged her to notify us of these should they develop between visits.  I recommend she have follow-up every 6 months for 5 years in accordance with NCCN guidelines. Those visits should include symptom assessment, physical exam and pelvic examination. Pap smears are not indicated or recommended in the routine surveillance of endometrial cancer.  HPI: [name] is a 64 year old P3 who was seen in consultation at the request of Dr Andrea Eaton for evaluation of endometrial cancer.  The patient reported a 2-year history of postmenopausal bleeding.  She has a primary care doctor whom she sees regularly, Dr. Buelah Eaton, but did not mention the symptoms to Dr. Buelah Eaton until she saw her in February 2021.  At that time Dr. Buelah Eaton  performed a Pap test that was normal and referred the patient to Dr. Glo Eaton who saw her in March 2021.  Dr. Glo Eaton, and OB/GYN then performed a transvaginal ultrasound scan on May 12, 2019 which revealed a uterus measuring 7.9 x 6.3 x 6.7 cm with multiple small fibroids.  The endometrium was 18 mm and complex and thickened.  The right and left ovaries were grossly normal.  She was then taken to the operating room for a D&C procedure on Jun 29, 2019 which revealed complex atypical hyperplasia with foci worrisome for FIGO grade 1 endometrioid carcinoma involving endometrioid type polyps.  The patient's medical history is significant for morbid obesity.  She has a BMI of 45 kg meters squared.  She has hypertension, and prediabetes with her most recent hemoglobin A1c 5.7 on April 13, 2019.  Her prediabetes is diet controlled.  She has never had major surgery with her only surgery being the D&C procedure.  She has had 3 prior vaginal deliveries including a stillbirth, and 2 live-born children.  Her only prior hospitalization was for upper thigh cellulitis in approximately 2015.  Her past family history is significant for an aunt with cervical cancer.  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  She lives with her husband who is a diabetic and double amputee but fairly independent with activities of daily living at home.  She also lives with her 73 year old grandson.  Interval Hx:  On 07/29/19 she underwent robotic assisted total hysterectomy, BSO, SLN biopsy. Intraoperative findings were significant for a 7cm uterus with anterior fundal fibroid, normal ovaries bilaterally, normal tubes and no  suspicious nodes. Surgery was uncomplicated.  Final pathology revealed stage IA grade 1 endometrioid endometrial adenocarcinoma, 0.4cm, no myometrial invasion, no LVSI, no cervical/adnexal or lymph node involvement. MMR pending.  She was determined to have low risk features and no adjuvant therapy was  recommended in accordance with NCCN guidelines.  Since surgery she has done well with no specific complaints.   Current Meds:  Outpatient Encounter Medications as of 08/05/2019  Medication Sig  . allopurinol (ZYLOPRIM) 100 MG tablet TAKE 1 TABLET BY MOUTH EVERY DAY (Patient taking differently: Take 100 mg by mouth daily. )  . ibuprofen (ADVIL) 800 MG tablet TAKE 1 TABLET BY MOUTH EVERY 8 HOURS AS NEEDED (Patient taking differently: Take 800 mg by mouth every 8 (eight) hours as needed (pain.). )  . Multiple Vitamin (MULTIVITAMIN WITH MINERALS) TABS tablet Take 1 tablet by mouth daily.  . Potassium 99 MG TABS Take 99 mg by mouth daily.  Marland Kitchen senna-docusate (SENOKOT-S) 8.6-50 MG tablet Take 2 tablets by mouth at bedtime. For AFTER surgery, do not take if having diarrhea  . torsemide (DEMADEX) 20 MG tablet TAKE 1 TABLET (20 MG TOTAL) BY MOUTH 2 (TWO) TIMES DAILY AS NEEDED. (Patient taking differently: Take 20-40 mg by mouth See admin instructions. Take 1 tablet (20 mg) by mouth scheduled in the morning, may take additional tablet in the morning (40 mg) if needed for fluid retention.)  . traMADol (ULTRAM) 50 MG tablet Take 1 tablet (50 mg total) by mouth every 6 (six) hours as needed for severe pain. For AFTER surgery, do not take and drive  . vitamin C (ASCORBIC ACID) 500 MG tablet Take 500 mg by mouth daily.   No facility-administered encounter medications on file as of 08/05/2019.    Allergy: No Known Allergies  Social Hx:   Social History   Socioeconomic History  . Marital status: Married    Spouse name: Not on file  . Number of children: Not on file  . Years of education: Not on file  . Highest education level: Not on file  Occupational History  . Not on file  Tobacco Use  . Smoking status: Never Smoker  . Smokeless tobacco: Never Used  Vaping Use  . Vaping Use: Never used  Substance and Sexual Activity  . Alcohol use: No  . Drug use: No  . Sexual activity: Never  Other Topics  Concern  . Not on file  Social History Narrative  . Not on file   Social Determinants of Health   Financial Resource Strain:   . Difficulty of Paying Living Expenses:   Food Insecurity:   . Worried About Charity fundraiser in the Last Year:   . Arboriculturist in the Last Year:   Transportation Needs:   . Film/video editor (Medical):   Marland Kitchen Lack of Transportation (Non-Medical):   Physical Activity:   . Days of Exercise per Week:   . Minutes of Exercise per Session:   Stress:   . Feeling of Stress :   Social Connections:   . Frequency of Communication with Friends and Family:   . Frequency of Social Gatherings with Friends and Family:   . Attends Religious Services:   . Active Member of Clubs or Organizations:   . Attends Archivist Meetings:   Marland Kitchen Marital Status:   Intimate Partner Violence:   . Fear of Current or Ex-Partner:   . Emotionally Abused:   Marland Kitchen Physically Abused:   . Sexually Abused:  Past Surgical Hx:  Past Surgical History:  Procedure Laterality Date  . COLONOSCOPY  07/2007   Dr. Fuller Plan: diverticulosis, internal hemorrhoids  . COLONOSCOPY N/A 03/11/2018   Procedure: COLONOSCOPY;  Surgeon: Danie Binder, MD;  Location: [facility];  Service: Endoscopy;  Laterality: N/A;  9:30am  . ESOPHAGOGASTRODUODENOSCOPY  07/2007   Dr. Fuller Plan: H.pylori gastritis  . HYSTEROSCOPY WITH D & C N/A 06/29/2019   Procedure: DILATATION AND CURETTAGE /HYSTEROSCOPY, removal of endometrial polyp;  Surgeon: Jonnie Kind, MD;  Location: [facility];  Service: Gynecology;  Laterality: N/A;  . IR US GUIDE VASC ACCESS RIGHT  05/27/2019  . IR VENIPUNCTURE 26YRS/OLDER BY MD  05/27/2019  . POLYPECTOMY  03/11/2018   Procedure: POLYPECTOMY;  Surgeon: Danie Binder, MD;  Location: [facility];  Service: Endoscopy;;  descending colon  . ROBOTIC ASSISTED TOTAL HYSTERECTOMY WITH BILATERAL SALPINGO OOPHERECTOMY Bilateral 07/29/2019   Procedure: XI ROBOTIC ASSISTED TOTAL HYSTERECTOMY WITH  BILATERAL SALPINGO OOPHORECTOMY;  Surgeon: Everitt Amber, MD;  Location: [facility];  Service: Gynecology;  Laterality: Bilateral;  . SENTINEL NODE BIOPSY N/A 07/29/2019   Procedure: SENTINEL NODE BIOPSY;  Surgeon: Everitt Amber, MD;  Location: [facility];  Service: Gynecology;  Laterality: N/A;    Past Medical Hx:  Past Medical History:  Diagnosis Date  . Arthritis    Dr. Sharol Given  . Hypertension   . Prediabetes     Past Gynecological History:  See HPI No LMP recorded. Patient is postmenopausal.  Family Hx:  Family History  Problem Relation Age of Onset  . Hypertension Mother   . Diabetes Mother   . Hypertension Father   . Heart disease Father   . Diabetes Sister   . Hypertension Sister   . Hyperlipidemia Sister   . Heart disease Sister   . Hypertension Brother   . Diabetes Brother   . Colon cancer Neg Hx     Review of Systems:  Constitutional  Feels well,    ENT Normal appearing ears and nares bilaterally Skin/Breast  No rash, sores, jaundice, itching, dryness Cardiovascular  No chest pain, shortness of breath, or edema  Pulmonary  No cough or wheeze.  Gastro Intestinal  No nausea, vomitting, or diarrhoea. No bright red blood per rectum, no abdominal pain, change in bowel movement, or constipation.  Genito Urinary  No frequency, urgency, dysuria, + postmenopausal bleeding  Musculo Skeletal  No myalgia, arthralgia, joint swelling or pain  Neurologic  No weakness, numbness, change in gait,  Psychology  No depression, anxiety, insomnia.   Vitals:  There were no vitals taken for this visit.  Physical Exam: Deferred (phone visit).  I discussed the assessment and treatment plan with the patient. The patient was provided with an opportunity to ask questions and all were answered. The patient agreed with the plan and demonstrated an understanding of the instructions.   The patient was advised to call back or see an in-person evaluation if the symptoms worsen or if the condition  fails to improve as anticipated.   I provided 10 minutes of non face-to-face telephone visit time during this encounter, and > 50% was spent counseling as documented under my assessment & plan.    Thereasa Solo, MD  08/05/2019, 4:02 PM

## 2019-08-06 ENCOUNTER — Encounter (HOSPITAL_COMMUNITY): Payer: Self-pay | Admitting: *Deleted

## 2019-08-06 ENCOUNTER — Other Ambulatory Visit: Payer: Self-pay

## 2019-08-06 ENCOUNTER — Emergency Department (HOSPITAL_COMMUNITY): Payer: BC Managed Care – PPO

## 2019-08-06 ENCOUNTER — Emergency Department (HOSPITAL_COMMUNITY)
Admission: EM | Admit: 2019-08-06 | Discharge: 2019-08-06 | Disposition: A | Discharge status: Home / Self Care | Payer: BC Managed Care – PPO | Attending: Emergency Medicine | Admitting: Emergency Medicine

## 2019-08-06 ENCOUNTER — Telehealth: Payer: Self-pay

## 2019-08-06 DIAGNOSIS — T8143XA Infection following a procedure, organ and space surgical site, initial encounter: Secondary | ICD-10-CM | POA: Insufficient documentation

## 2019-08-06 DIAGNOSIS — I10 Essential (primary) hypertension: Secondary | ICD-10-CM | POA: Diagnosis not present

## 2019-08-06 DIAGNOSIS — Z79899 Other long term (current) drug therapy: Secondary | ICD-10-CM | POA: Insufficient documentation

## 2019-08-06 DIAGNOSIS — Y828 Other medical devices associated with adverse incidents: Secondary | ICD-10-CM | POA: Insufficient documentation

## 2019-08-06 DIAGNOSIS — Z8542 Personal history of malignant neoplasm of other parts of uterus: Secondary | ICD-10-CM | POA: Insufficient documentation

## 2019-08-06 LAB — COMPREHENSIVE METABOLIC PANEL
ALT: 55 U/L — ABNORMAL HIGH (ref 0–44)
AST: 55 U/L — ABNORMAL HIGH (ref 15–41)
Albumin: 2.9 g/dL — ABNORMAL LOW (ref 3.5–5.0)
Alkaline Phosphatase: 87 U/L (ref 38–126)
Anion gap: 9 (ref 5–15)
BUN: 18 mg/dL (ref 8–23)
CO2: 28 mmol/L (ref 22–32)
Calcium: 8.5 mg/dL — ABNORMAL LOW (ref 8.9–10.3)
Chloride: 101 mmol/L (ref 98–111)
Creatinine, Ser: 0.97 mg/dL (ref 0.44–1.00)
GFR calc Af Amer: >60 mL/min (ref >60)
GFR calc non Af Amer: >60 mL/min (ref >60)
Glucose, Bld: 114 mg/dL — ABNORMAL HIGH (ref 70–99)
Potassium: 3.3 mmol/L — ABNORMAL LOW (ref 3.5–5.1)
Sodium: 138 mmol/L (ref 135–145)
Total Bilirubin: 1.4 mg/dL — ABNORMAL HIGH (ref 0.3–1.2)
Total Protein: 6.6 g/dL (ref 6.5–8.1)

## 2019-08-06 LAB — CBC WITH DIFFERENTIAL/PLATELET
Abs Immature Granulocytes: 0.05 10*3/uL (ref 0.00–0.07)
Basophils Absolute: 0 10*3/uL (ref 0.0–0.1)
Basophils Relative: 0 %
Eosinophils Absolute: 0.1 10*3/uL (ref 0.0–0.5)
Eosinophils Relative: 1 %
HCT: 34 % — ABNORMAL LOW (ref 36.0–46.0)
Hemoglobin: 11.5 g/dL — ABNORMAL LOW (ref 12.0–15.0)
Immature Granulocytes: 1 %
Lymphocytes Relative: 14 %
Lymphs Abs: 1.3 10*3/uL (ref 0.7–4.0)
MCH: 29 pg (ref 26.0–34.0)
MCHC: 33.8 g/dL (ref 30.0–36.0)
MCV: 85.9 fL (ref 80.0–100.0)
Monocytes Absolute: 0.5 10*3/uL (ref 0.1–1.0)
Monocytes Relative: 6 %
Neutro Abs: 7.1 10*3/uL (ref 1.7–7.7)
Neutrophils Relative %: 78 %
Platelets: 247 10*3/uL (ref 150–400)
RBC: 3.96 MIL/uL (ref 3.87–5.11)
RDW: 14.5 % (ref 11.5–15.5)
WBC: 9.1 10*3/uL (ref 4.0–10.5)
nRBC: 0 % (ref 0.0–0.2)

## 2019-08-06 LAB — URINALYSIS, ROUTINE W REFLEX MICROSCOPIC
Bacteria, UA: NONE SEEN
Bilirubin Urine: NEGATIVE
Glucose, UA: NEGATIVE mg/dL
Ketones, ur: NEGATIVE mg/dL
Leukocytes,Ua: NEGATIVE
Nitrite: NEGATIVE
Protein, ur: NEGATIVE mg/dL
Specific Gravity, Urine: 1.023 (ref 1.005–1.030)
pH: 5 (ref 5.0–8.0)

## 2019-08-06 LAB — PROTIME-INR
INR: 1.2 (ref 0.8–1.2)
Prothrombin Time: 15.1 seconds (ref 11.4–15.2)

## 2019-08-06 LAB — APTT: aPTT: 37 seconds — ABNORMAL HIGH (ref 24–36)

## 2019-08-06 LAB — LACTIC ACID, PLASMA: Lactic Acid, Venous: 0.7 mmol/L (ref 0.5–1.9)

## 2019-08-06 MED ORDER — METRONIDAZOLE IN NACL 5-0.79 MG/ML-% IV SOLN
500.0000 mg | Freq: Once | INTRAVENOUS | Status: AC
  Administered 2019-08-06: 500 mg via INTRAVENOUS
  Filled 2019-08-06: qty 100

## 2019-08-06 MED ORDER — SODIUM CHLORIDE 0.9 % IV BOLUS (SEPSIS)
600.0000 mL | Freq: Once | INTRAVENOUS | Status: AC
  Administered 2019-08-06: 600 mL via INTRAVENOUS

## 2019-08-06 MED ORDER — METRONIDAZOLE 500 MG PO TABS
500.0000 mg | ORAL_TABLET | Freq: Two times a day (BID) | ORAL | 0 refills | Status: DC
Start: 2019-08-06 — End: 2019-10-19

## 2019-08-06 MED ORDER — IOHEXOL 9 MG/ML PO SOLN
ORAL | Status: AC
  Filled 2019-08-06: qty 1000

## 2019-08-06 MED ORDER — SODIUM CHLORIDE 0.9 % IV BOLUS (SEPSIS)
1000.0000 mL | Freq: Once | INTRAVENOUS | Status: AC
  Administered 2019-08-06: 1000 mL via INTRAVENOUS

## 2019-08-06 MED ORDER — ACETAMINOPHEN 500 MG PO TABS
1000.0000 mg | ORAL_TABLET | Freq: Once | ORAL | Status: AC
  Administered 2019-08-06: 1000 mg via ORAL
  Filled 2019-08-06: qty 2

## 2019-08-06 MED ORDER — SODIUM CHLORIDE 0.9 % IV SOLN
2.0000 g | Freq: Once | INTRAVENOUS | Status: AC
  Administered 2019-08-06: 2 g via INTRAVENOUS
  Filled 2019-08-06: qty 20

## 2019-08-06 MED ORDER — IOHEXOL 300 MG/ML  SOLN
100.0000 mL | Freq: Once | INTRAMUSCULAR | Status: AC | PRN
  Administered 2019-08-06: 100 mL via INTRAVENOUS

## 2019-08-06 MED ORDER — LEVOFLOXACIN 500 MG PO TABS
500.0000 mg | ORAL_TABLET | Freq: Every day | ORAL | 0 refills | Status: DC
Start: 2019-08-06 — End: 2019-08-09

## 2019-08-06 NOTE — ED Triage Notes (Signed)
States she had a robotic hysterectomy last week and began to run a fever today, advised by her doctor to come in for evaluation and treatment

## 2019-08-06 NOTE — Discharge Instructions (Signed)
Please take antibiotics as prescribed.  Your doctor will call you to schedule a close follow-up appointment.  Return if you have any concern.

## 2019-08-06 NOTE — ED Provider Notes (Signed)
[facility] EMERGENCY DEPARTMENT Provider Note   CSN: [id number] Arrival date & time: 08/06/19  1553     History Chief Complaint  Patient presents with   Abdominal Pain   Post-op Problem   Urinary Frequency    Andrea Eaton is a 64 y.o. female.  The history is provided by the patient and medical records. No language interpreter was used.  Abdominal Pain Urinary Frequency Associated symptoms include abdominal pain.     64 year old female with history of endometrial cancer in the setting of morbid obesity status post robotic-assisted laparoscopic total hysterectomy on 07/29/2019 by Dr. Denman George presenting for evaluation of abdominal pain.  Patient report after the procedure, she felt fine.  For the past 3 days she endorses fever, chills, having increased urinary frequency and urgency, having pain to her lower abdomen.  Pain is sharp, 6 out of 10, waxing and waning.  She endorsed mild nausea without vomiting.  No chest pain shortness of breath productive cough.  No hematuria or vaginal bleeding.  She did took ibuprofen prior to arrival.  She did reach out to her doctor who recommended coming to the ER for further evaluation.  She has had her Covid vaccination.  Past Medical History:  Diagnosis Date   Arthritis    Dr. Sharol Given   Hypertension    Prediabetes     Patient Active Problem List   Diagnosis Date Noted   Endometrial cancer ([place]) 06/23/2019   Gait instability 04/12/2019   Borderline diabetes 08/05/2018   Gout 08/05/2018   Mild anxiety 03/06/2018   Heme positive stool 12/22/2017   Dizziness 09/01/2017   Constipation 04/29/2017   Peripheral edema 10/09/2015   Hemorrhoids 10/28/2014   Routine general medical examination at a health care facility 03/19/2013   Neuropathy, arm 10/15/2012   Hot flashes 10/15/2012   Class 3 obesity 01/14/2012   Insomnia 01/14/2012   OA (osteoarthritis) 01/14/2012   Essential hypertension 08/13/2007    Past  Surgical History:  Procedure Laterality Date   COLONOSCOPY  07/2007   Dr. Fuller Plan: diverticulosis, internal hemorrhoids   COLONOSCOPY N/A 03/11/2018   Procedure: COLONOSCOPY;  Surgeon: Danie Binder, MD;  Location: [facility];  Service: Endoscopy;  Laterality: N/A;  9:30am   ESOPHAGOGASTRODUODENOSCOPY  07/2007   Dr. Fuller Plan: H.pylori gastritis   HYSTEROSCOPY WITH D & C N/A 06/29/2019   Procedure: DILATATION AND CURETTAGE /HYSTEROSCOPY, removal of endometrial polyp;  Surgeon: Jonnie Kind, MD;  Location: [facility] ORS;  Service: Gynecology;  Laterality: N/A;   IR US GUIDE VASC ACCESS RIGHT  05/27/2019   IR VENIPUNCTURE 80YRS/OLDER BY MD  05/27/2019   POLYPECTOMY  03/11/2018   Procedure: POLYPECTOMY;  Surgeon: Danie Binder, MD;  Location: [facility];  Service: Endoscopy;;  descending colon   ROBOTIC ASSISTED TOTAL HYSTERECTOMY WITH BILATERAL SALPINGO OOPHERECTOMY Bilateral 07/29/2019   Procedure: XI ROBOTIC ASSISTED TOTAL HYSTERECTOMY WITH BILATERAL SALPINGO OOPHORECTOMY;  Surgeon: Everitt Amber, MD;  Location: [facility] OR[facility];  Service: Gynecology;  Laterality: Bilateral;   SENTINEL NODE BIOPSY N/A 07/29/2019   Procedure: SENTINEL NODE BIOPSY;  Surgeon: Everitt Amber, MD;  Location: [facility] ORS;  Service: Gynecology;  Laterality: N/A;     OB History    Gravida  3   Para      Term      Preterm      AB  1   Living  3     SAB  1   TAB      Ectopic  Multiple      Live Births  2           Family History  Problem Relation Age of Onset   Hypertension Mother    Diabetes Mother    Hypertension Father    Heart disease Father    Diabetes Sister    Hypertension Sister    Hyperlipidemia Sister    Heart disease Sister    Hypertension Brother    Diabetes Brother    Colon cancer Neg Hx     Social History   Tobacco Use   Smoking status: Never Smoker   Smokeless tobacco: Never Used  Scientific laboratory technician Use: Never used  Substance Use Topics   Alcohol use: No     Drug use: No    Home Medications Prior to Admission medications   Medication Sig Start Date End Date Taking? Authorizing Provider  allopurinol (ZYLOPRIM) 100 MG tablet TAKE 1 TABLET BY MOUTH EVERY DAY Patient taking differently: Take 100 mg by mouth daily.  06/04/19   Dillwyn, Modena Nunnery, MD  ibuprofen (ADVIL) 800 MG tablet TAKE 1 TABLET BY MOUTH EVERY 8 HOURS AS NEEDED Patient taking differently: Take 800 mg by mouth every 8 (eight) hours as needed (pain.).  01/13/19   Alycia Rossetti, MD  Multiple Vitamin (MULTIVITAMIN WITH MINERALS) TABS tablet Take 1 tablet by mouth daily.    [provider]  Potassium 99 MG TABS Take 99 mg by mouth daily.    [provider]  senna-docusate (SENOKOT-S) 8.6-50 MG tablet Take 2 tablets by mouth at bedtime. For AFTER surgery, do not take if having diarrhea 07/13/19   Cross, Lenna Sciara D, NP  torsemide (DEMADEX) 20 MG tablet TAKE 1 TABLET (20 MG TOTAL) BY MOUTH 2 (TWO) TIMES DAILY AS NEEDED. Patient taking differently: Take 20-40 mg by mouth See admin instructions. Take 1 tablet (20 mg) by mouth scheduled in the morning, may take additional tablet in the morning (40 mg) if needed for fluid retention. 10/19/18   Brandon, Modena Nunnery, MD  traMADol (ULTRAM) 50 MG tablet Take 1 tablet (50 mg total) by mouth every 6 (six) hours as needed for severe pain. For AFTER surgery, do not take and drive 1/60/73   Cross, Lenna Sciara D, NP  vitamin C (ASCORBIC ACID) 500 MG tablet Take 500 mg by mouth daily.    [provider]    Allergies    Patient has no known allergies.  Review of Systems   Review of Systems  Gastrointestinal: Positive for abdominal pain.  Genitourinary: Positive for frequency.  All other systems reviewed and are negative.   Physical Exam Updated Vital Signs BP (!) 110/58    Pulse 82    Temp (!) 102 F (38.9 C) (Oral)    Resp (!) 22    SpO2 97%   Physical Exam Vitals and nursing note reviewed.  Constitutional:      General:  She is not in acute distress.    Appearance: She is well-developed. She is obese.  HENT:     Head: Atraumatic.  Eyes:     Conjunctiva/sclera: Conjunctivae normal.  Cardiovascular:     Rate and Rhythm: Normal rate and regular rhythm.     Heart sounds: Normal heart sounds.  Pulmonary:     Effort: Pulmonary effort is normal.     Breath sounds: No rhonchi.  Abdominal:     General: Abdomen is flat.     Palpations: Abdomen is soft.  Tenderness: There is abdominal tenderness (Mild suprapubic tenderness without guarding or rebound tenderness.) in the suprapubic area.  Musculoskeletal:     Cervical back: Neck supple.  Skin:    Findings: No rash.  Neurological:     Mental Status: She is alert and oriented to person, place, and time.  Psychiatric:        Mood and Affect: Mood normal.     ED Results / Procedures / Treatments   Labs (all labs ordered are listed, but only abnormal results are displayed) Labs Reviewed  COMPREHENSIVE METABOLIC PANEL - Abnormal; Notable for the following components:      Result Value   Potassium 3.3 (*)    Glucose, Bld 114 (*)    Calcium 8.5 (*)    Albumin 2.9 (*)    AST 55 (*)    ALT 55 (*)    Total Bilirubin 1.4 (*)    All other components within normal limits  CBC WITH DIFFERENTIAL/PLATELET - Abnormal; Notable for the following components:   Hemoglobin 11.5 (*)    HCT 34.0 (*)    All other components within normal limits  APTT - Abnormal; Notable for the following components:   aPTT 37 (*)    All other components within normal limits  URINALYSIS, ROUTINE W REFLEX MICROSCOPIC - Abnormal; Notable for the following components:   Hgb urine dipstick SMALL (*)    All other components within normal limits  CULTURE, BLOOD (ROUTINE X 2)  CULTURE, BLOOD (ROUTINE X 2)  URINE CULTURE  LACTIC ACID, PLASMA  PROTIME-INR    EKG EKG Interpretation  Date/Time:  Friday August 06 2019 16:33:09 EDT Ventricular Rate:  83 PR Interval:    QRS  Duration: 92 QT Interval:  336 QTC Calculation: 395 R Axis:   -2 Text Interpretation: Sinus rhythm since last tracing no significant change Confirmed by Daleen Bo [phone]) on 08/06/2019 5:24:34 PM   Radiology CT ABDOMEN PELVIS W CONTRAST  Result Date: 08/06/2019 CLINICAL DATA:  Sepsis, recent hysterectomy fever EXAM: CT ABDOMEN AND PELVIS WITH CONTRAST TECHNIQUE: Multidetector CT imaging of the abdomen and pelvis was performed using the standard protocol following bolus administration of intravenous contrast. CONTRAST:  18mL OMNIPAQUE IOHEXOL 300 MG/ML  SOLN COMPARISON:  None. FINDINGS: Lower chest: Lung bases are clear. Hepatobiliary: No focal liver abnormality is seen. No gallstones, gallbladder wall thickening, or biliary dilatation. Pancreas: Unremarkable. No pancreatic ductal dilatation or surrounding inflammatory changes. Spleen: Normal in size without focal abnormality. Adrenals/Urinary Tract: Adrenal glands are unremarkable. Small cyst in the right kidney. Adrenal glands are normal. No hydronephrosis. Stomach/Bowel: Stomach is nonenlarged. No dilated small bowel. Negative appendix. Scattered sigmoid colon diverticula without acute inflammatory change. Thickened small bowel within the right posterior pelvis, likely reactive from recent surgery. Vascular/Lymphatic: Mild aortic atherosclerosis. No aneurysm. No suspicious adenopathy. Reproductive: Status post hysterectomy. No adnexal mass. Minimally rim enhancing small fluid collection, best seen on coronal views measuring 3.1 cm, slightly cranial to the vaginal cuff and posterior to the bladder. No internal gas bubbles. Other: No free air. Musculoskeletal: Small fat containing umbilical hernia. Edema within the subcutaneous fat of the anterior abdominal wall. No acute osseous abnormality IMPRESSION: 1. Status post hysterectomy. Small 3.1 cm fluid collection within the right posterior pelvis, cranial to the vaginal cuff suggestive of postoperative  fluid collection. No internal gas bubbles or significant inflammatory change to suggest organizing abscess at this time. 2. Thickened loop of small bowel in the right posterior pelvis likely reactive to recent surgical change.  3. Scattered diverticular disease of the colon without acute inflammatory process. Electronically Signed   By: Donavan Foil M.D.   On: 08/06/2019 20:44   DG Chest Port 1 View  Result Date: 08/06/2019 CLINICAL DATA:  Pt states she had a robotic hysterectomy last week and began to run a fever today. COVID negative 07/26/19. Hx HTN, nonsmoker EXAM: PORTABLE CHEST 1 VIEW COMPARISON:  Chest radiograph 09/27/2012 FINDINGS: Stable cardiomediastinal contours. The lungs are clear. No pneumothorax or pleural effusion. No acute finding in the visualized skeleton. IMPRESSION: No evidence of active disease. Electronically Signed   By: Audie Pinto M.D.   On: 08/06/2019 17:08    Procedures .Critical Care Performed by: Domenic Moras, PA-C Authorized by: Domenic Moras, PA-C   Critical care provider statement:    Critical care time (minutes):  32   Critical care was time spent personally by me on the following activities:  Discussions with consultants, evaluation of patient's response to treatment, examination of patient, ordering and performing treatments and interventions, ordering and review of laboratory studies, ordering and review of radiographic studies, pulse oximetry, re-evaluation of patient's condition, obtaining history from patient or surrogate and review of old charts   (including critical care time)  Medications Ordered in ED Medications  sodium chloride 0.9 % bolus 1,000 mL (has no administration in time range)    And  sodium chloride 0.9 % bolus 600 mL (has no administration in time range)  cefTRIAXone (ROCEPHIN) 2 g in sodium chloride 0.9 % 100 mL IVPB (has no administration in time range)  metroNIDAZOLE (FLAGYL) IVPB 500 mg (has no administration in time range)     ED Course  I have reviewed the triage vital signs and the nursing notes.  Pertinent labs & imaging results that were available during my care of the patient were reviewed by me and considered in my medical decision making (see chart for details).    MDM Rules/Calculators/A&P                          BP 112/74    Pulse 85    Temp 98.4 F (36.9 C) (Oral)    Resp (!) 21    SpO2 95%   Final Clinical Impression(s) / ED Diagnoses Final diagnoses:  Infection of organ or organ space after surgery, initial encounter    Rx / DC Orders ED Discharge Orders         Ordered    metroNIDAZOLE (FLAGYL) 500 MG tablet  2 times daily     Discontinue  Reprint     08/06/19 2334    levofloxacin (LEVAQUIN) 500 MG tablet  Daily     Discontinue  Reprint     08/06/19 2334         5:09 PM Patient presents with abdominal pain and fever status post laparoscopic total hysterectomy performed 8 days ago.  She does have, urinary frequency and urgency which may account for source of infection as a urinary source.  She is here with a temperature of 102, increased respiratory rate of 22.  Code sepsis initiated, will obtain abdominal pelvis CT scan for evaluation.  Will check UA.  IV fluid given per protocol.  Antibiotic to cover for intra-abdominal infection initiated.  9:51 PM Patient's labs are reassuring, normal lactic acid, no leukocytosis, mild hypokalemia with a potassium of 3.3, mildly elevated transaminitis with AST 55, ALT 55, total bili of 1.4.  Chest x-ray shows no evidence of active  disease.  Abdominal pelvis CT scan demonstrate status post hysterectomy with a small 3.1 cm fluid collection within the right posterior pelvis suggestive of postoperative fluid collection.  No internal gas bubbles or significant inflammatory changes to suggest organizing abscess at this time.  Currently UA is pending.  If positive, this is likely the source of her infection.  If UA is unremarkable, will need to consult Dr.  Denman George for further management.  11:03 PM Bladder scan revealed greater than 400 cc of urine.  However patient request to sit on the commode to use the bathroom.  She was able to successfully urinate.  She has normal lactic acid, normal white count, therefore code sepsis canceled.  Patient without chest pain shortness of breath or hypoxia to suggest PE.  Urinalysis without evidence of UTI.  Care discussed with Dr. Eulis Foster.   11:32 PM Appreciate consultation from on-call gynecologist, Dr. Jeral Pinch, who recommend starting patient on levofloxacin as well as Flagyl and her office will contact patient for outpatient follow-up in 1 week. Pt is aware and agrees with plan.  She is stable for discharge.    Andrea Eaton was evaluated in Emergency Department on 08/06/2019 for the symptoms described in the history of present illness. She was evaluated in the context of the global COVID-19 pandemic, which necessitated consideration that the patient might be at risk for infection with the SARS-CoV-2 virus that causes COVID-19. Institutional protocols and algorithms that pertain to the evaluation of patients at risk for COVID-19 are in a state of rapid change based on information released by regulatory bodies including the CDC and federal and state organizations. These policies and algorithms were followed during the patient's care in the ED.    Domenic Moras, PA-C 08/06/19 [id number]    Daleen Bo, MD 08/08/19 1024

## 2019-08-06 NOTE — ED Notes (Signed)
Pt brief changed and pt cleaned up. 

## 2019-08-06 NOTE — ED Notes (Signed)
Patient transported to CT 

## 2019-08-06 NOTE — Telephone Encounter (Signed)
Andrea Eaton states that she began with lower abdominal pain with urination last evening. It has continued through today. She had some loose stool yesterday a couple of times. She said that she does has som bilateral flank pain that began ~ 2 days ago. She had some mild chills earlier today. Pt had not check temperature. Pt took a couple of Temporal temps while on the phone and the tempeture reading ranged from 101.2-101.6 degrees. Pt is S/P Robotic-assisted laparoscopic total hysterectomy with BSO on 07-29-19 with Dr. Everitt Amber. Reviewed above with Dr. Denman George and Joylene John, NP.  Since Andrea Eaton lives in [place] and does not have an immediate transportation Dr. Denman George wants her to go to [facility] ED to be evaluated with CT scan Abdomen and Pelvis and fever work up since it is feasible for her to be evaluated in the office Reviewed above with Nurse Katharine Look in [facility] ED.

## 2019-08-08 ENCOUNTER — Telehealth: Payer: Self-pay | Admitting: Gynecologic Oncology

## 2019-08-08 LAB — URINE CULTURE: Culture: NO GROWTH

## 2019-08-08 NOTE — Telephone Encounter (Addendum)
Called patient to check in after her recent emergency department visit on Friday.  Overall, she feels like things are improving.  She denies any further fevers since being discharged.  She is taking her antibiotics without difficulty.  She continues to have some diffuse abdominal pain especially lower abdominal pain.  Has had some diarrhea, + associated foul odor.  Urinating without any difficulty. Denies VB or discharge.  Offered to see patient in clinic this week.  She would like to come in tomorrow and we discussed having her present at 12:45.  Jeral Pinch MD Gynecologic Oncology

## 2019-08-09 ENCOUNTER — Other Ambulatory Visit: Payer: Self-pay

## 2019-08-09 ENCOUNTER — Inpatient Hospital Stay (HOSPITAL_BASED_OUTPATIENT_CLINIC_OR_DEPARTMENT_OTHER): Payer: BC Managed Care – PPO | Admitting: Gynecologic Oncology

## 2019-08-09 ENCOUNTER — Encounter: Payer: Self-pay | Admitting: Gynecologic Oncology

## 2019-08-09 VITALS — BP 115/74 | HR 82 | Temp 98.5°F | Resp 26 | Ht 62.0 in | Wt 255.1 lb

## 2019-08-09 DIAGNOSIS — Z90710 Acquired absence of both cervix and uterus: Secondary | ICD-10-CM | POA: Diagnosis not present

## 2019-08-09 DIAGNOSIS — Z90722 Acquired absence of ovaries, bilateral: Secondary | ICD-10-CM | POA: Diagnosis not present

## 2019-08-09 DIAGNOSIS — R1030 Lower abdominal pain, unspecified: Secondary | ICD-10-CM | POA: Diagnosis not present

## 2019-08-09 DIAGNOSIS — Z79899 Other long term (current) drug therapy: Secondary | ICD-10-CM | POA: Diagnosis not present

## 2019-08-09 DIAGNOSIS — R197 Diarrhea, unspecified: Secondary | ICD-10-CM

## 2019-08-09 DIAGNOSIS — M1990 Unspecified osteoarthritis, unspecified site: Secondary | ICD-10-CM | POA: Diagnosis not present

## 2019-08-09 DIAGNOSIS — C541 Malignant neoplasm of endometrium: Secondary | ICD-10-CM | POA: Diagnosis present

## 2019-08-09 DIAGNOSIS — I10 Essential (primary) hypertension: Secondary | ICD-10-CM | POA: Diagnosis not present

## 2019-08-09 NOTE — Patient Instructions (Addendum)
You are pelvic exam is reassuring today.  I will call you with the results of your stool test as soon as I have them.  Please only continue taking the Flagyl or metronidazole. Please don't take any medications to prevent diarrhea until I call you with your stool results.

## 2019-08-09 NOTE — Progress Notes (Signed)
Gynecologic Oncology Return Clinic Visit  6/21  Reason for Visit: follow-up recent ED visit.  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 At the ED, she was febrile to 38.9C and mildly tachypneic with a respiratory rate of 22. Other VS were normal. Abdomen was tender, suprapubic tenderness, but not acute. WBC and lactate were both normal. UA not suspicious for UTI. CT showed small 3cm pelvic fluid collection (without features to suggest developing abscess) and some mild stranding involving several loops of small bowel in the pelvis. Fever responded to tylenol. Patient was discharged home on PO antibiotics.   Patient reports overall feeling somewhat better today.  She woke up in the middle of the night last night with her sheets soaked.  She thinks that her fever broke at this time.  Overall her abdominal pain has improved.  She describes this as lower abdominal and right-sided pain that is sharp at times and intermediately travels up her right flank.  She continues to have diarrhea which started Thursday.  Diarrhea worsened after she was seen in the emergency department and for several days she was having bowel movements every 1-2 hours, each time she went to the bathroom.  She notes malodorous as well as dark stools that have been present for at least a day before her ED visit.  She denies any dysuria but notes some lower abdominal pain when she gets up to go to the bathroom.  She has had some minimal bright red bleeding with her bowel movements and has a history of hemorrhoids.  She took an antidiarrheal medication once at about 5 PM yesterday.  She has been taking her temperature when she feels warm and notes that it has been running in the 98-99 F range.  She has been  taking ibuprofen as needed for abdominal pain.  She is tolerating p.o. intake although overall has a decreased appetite.  She is trying to drink and stay hydrated.  Patient has been taking metronidazole twice a day since being seen in the hospital.  She reports having only one antibiotic sent to her pharmacy.  Past Medical/Surgical History: Past Medical History:  Diagnosis Date  . Arthritis    Dr. Sharol Given  . Hypertension   . Prediabetes     Past Surgical History:  Procedure Laterality Date  . COLONOSCOPY  07/2007   Dr. Fuller Plan: diverticulosis, internal hemorrhoids  . COLONOSCOPY N/A 03/11/2018   Procedure: COLONOSCOPY;  Surgeon: Danie Binder, MD;  Location: [facility];  Service: Endoscopy;  Laterality: N/A;  9:30am  . ESOPHAGOGASTRODUODENOSCOPY  07/2007   Dr. Fuller Plan: H.pylori gastritis  . HYSTEROSCOPY WITH D & C N/A 06/29/2019   Procedure: DILATATION AND CURETTAGE /HYSTEROSCOPY, removal of endometrial polyp;  Surgeon: Jonnie Kind, MD;  Location: [facility];  Service: Gynecology;  Laterality: N/A;  . IR US GUIDE VASC ACCESS RIGHT  05/27/2019  . IR VENIPUNCTURE 42YRS/OLDER BY MD  05/27/2019  . POLYPECTOMY  03/11/2018   Procedure: POLYPECTOMY;  Surgeon: Danie Binder, MD;  Location: [facility];  Service: Endoscopy;;  descending colon  . ROBOTIC ASSISTED TOTAL HYSTERECTOMY WITH BILATERAL SALPINGO OOPHERECTOMY Bilateral 07/29/2019   Procedure: XI ROBOTIC ASSISTED TOTAL HYSTERECTOMY WITH BILATERAL SALPINGO OOPHORECTOMY;  Surgeon: Everitt Amber, MD;  Location: [facility];  Service: Gynecology;  Laterality: Bilateral;  .  SENTINEL NODE BIOPSY N/A 07/29/2019   Procedure: SENTINEL NODE BIOPSY;  Surgeon: Everitt Amber, MD;  Location: [facility];  Service: Gynecology;  Laterality: N/A;    Family History  Problem Relation Age of Onset  . Hypertension Mother   . Diabetes Mother   . Hypertension Father   . Heart disease Father   . Diabetes Sister   . Hypertension Sister   . Hyperlipidemia Sister   . Heart  disease Sister   . Hypertension Brother   . Diabetes Brother   . Colon cancer Neg Hx     Social History   Socioeconomic History  . Marital status: Married    Spouse name: Not on file  . Number of children: Not on file  . Years of education: Not on file  . Highest education level: Not on file  Occupational History  . Not on file  Tobacco Use  . Smoking status: Never Smoker  . Smokeless tobacco: Never Used  Vaping Use  . Vaping Use: Never used  Substance and Sexual Activity  . Alcohol use: No  . Drug use: No  . Sexual activity: Never  Other Topics Concern  . Not on file  Social History Narrative  . Not on file   Social Determinants of Health   Financial Resource Strain:   . Difficulty of Paying Living Expenses:   Food Insecurity:   . Worried About Charity fundraiser in the Last Year:   . Arboriculturist in the Last Year:   Transportation Needs:   . Film/video editor (Medical):   Marland Kitchen Lack of Transportation (Non-Medical):   Physical Activity:   . Days of Exercise per Week:   . Minutes of Exercise per Session:   Stress:   . Feeling of Stress :   Social Connections:   . Frequency of Communication with Friends and Family:   . Frequency of Social Gatherings with Friends and Family:   . Attends Religious Services:   . Active Member of Clubs or Organizations:   . Attends Archivist Meetings:   Marland Kitchen Marital Status:     Current Medications:  Current Outpatient Medications:  .  allopurinol (ZYLOPRIM) 100 MG tablet, TAKE 1 TABLET BY MOUTH EVERY DAY (Patient taking differently: Take 100 mg by mouth daily. ), Disp: 90 tablet, Rfl: 0 .  ibuprofen (ADVIL) 800 MG tablet, TAKE 1 TABLET BY MOUTH EVERY 8 HOURS AS NEEDED (Patient taking differently: Take 800 mg by mouth every 8 (eight) hours as needed (pain.). ), Disp: 30 tablet, Rfl: 1 .  metroNIDAZOLE (FLAGYL) 500 MG tablet, Take 1 tablet (500 mg total) by mouth 2 (two) times daily. One po bid x 7 days, Disp: 14  tablet, Rfl: 0 .  Multiple Vitamin (MULTIVITAMIN WITH MINERALS) TABS tablet, Take 1 tablet by mouth daily., Disp: , Rfl:  .  Potassium 99 MG TABS, Take 99 mg by mouth daily., Disp: , Rfl:  .  senna-docusate (SENOKOT-S) 8.6-50 MG tablet, Take 2 tablets by mouth at bedtime. For AFTER surgery, do not take if having diarrhea, Disp: 30 tablet, Rfl: 0 .  torsemide (DEMADEX) 20 MG tablet, TAKE 1 TABLET (20 MG TOTAL) BY MOUTH 2 (TWO) TIMES DAILY AS NEEDED. (Patient taking differently: Take 20-40 mg by mouth See admin instructions. Take 1 tablet (20 mg) by mouth scheduled in the morning, may take additional tablet in the morning (40 mg) if needed for fluid retention.), Disp: 180 tablet, Rfl: 1 .  traMADol (ULTRAM) 50  MG tablet, Take 1 tablet (50 mg total) by mouth every 6 (six) hours as needed for severe pain. For AFTER surgery, do not take and drive, Disp: 10 tablet, Rfl: 0 .  vitamin C (ASCORBIC ACID) 500 MG tablet, Take 500 mg by mouth daily., Disp: , Rfl:   Review of Systems: Pertinent positives as per HPI Denies chills, fatigue, unexplained weight changes. Denies hearing loss, neck lumps or masses, mouth sores, ringing in ears or voice changes. Denies cough or wheezing.  Denies shortness of breath. Denies chest pain or palpitations. Denies leg swelling. Denies abdominal distention, constipation, nausea, vomiting, or early satiety. Denies pain with intercourse, dysuria, frequency, hematuria or incontinence. Denies hot flashes, pelvic pain, vaginal bleeding or vaginal discharge.   Denies joint pain, back pain or muscle pain/cramps. Denies itching, rash, or wounds. Denies dizziness, headaches, numbness or seizures. Denies swollen lymph nodes or glands, denies easy bruising or bleeding. Denies anxiety, depression, confusion, or decreased concentration.  Physical Exam: BP 115/74 (BP Location: Right Wrist, Patient Position: Sitting)   Pulse 82   Temp 98.5 F (36.9 C) (Oral)   Resp (!) 26   Ht 5'  2" (1.575 m)   Wt 255 lb 2 oz (115.7 kg)   SpO2 98%   BMI 46.66 kg/m  General: Alert, oriented, no acute distress. HEENT: Normocephalic, atraumatic, sclera anicteric. Chest: Unlabored breathing on room air Abdomen: Obese, soft, mild tenderness to palpation along right lower abdomen and right flank.  Normoactive bowel sounds.  No masses or hepatosplenomegaly appreciated.  Well-healing laparoscopic incisions. Extremities: Grossly normal range of motion.  Warm, well perfused.  No edema bilaterally. Skin: Some periincisional bruising noted. GU: Normal appearing external genitalia without erythema, excoriation, or lesions.  Speculum exam reveals cuff intact, no bleeding or discharge.  Bimanual exam reveals cuff intact, no fluctuance or discomfort with the exam.    Laboratory & Radiologic Studies: CT A/P on 6/18: IMPRESSION: 1. Status post hysterectomy. Small 3.1 cm fluid collection within the right posterior pelvis, cranial to the vaginal cuff suggestive of postoperative fluid collection. No internal gas bubbles or significant inflammatory change to suggest organizing abscess at this time. 2. Thickened loop of small bowel in the right posterior pelvis likely reactive to recent surgical change. 3. Scattered diverticular disease of the colon without acute inflammatory process.  CXR on 6/18: no active disease  CBC    Component Value Date/Time   WBC 9.1 08/06/2019 1702   RBC 3.96 08/06/2019 1702   HGB 11.5 (L) 08/06/2019 1702   HCT 34.0 (L) 08/06/2019 1702   PLT 247 08/06/2019 1702   MCV 85.9 08/06/2019 1702   MCH 29.0 08/06/2019 1702   MCHC 33.8 08/06/2019 1702   RDW 14.5 08/06/2019 1702   LYMPHSABS 1.3 08/06/2019 1702   MONOABS 0.5 08/06/2019 1702   EOSABS 0.1 08/06/2019 1702   BASOSABS 0.0 08/06/2019 1702   CMP Latest Ref Rng & Units 08/06/2019 07/20/2019 06/25/2019  Glucose 70 - 99 mg/dL 114(H) 87 75  BUN 8 - 23 mg/dL 18 20 23   Creatinine 0.44 - 1.00 mg/dL 0.97 0.84 0.81   Sodium 135 - 145 mmol/L 138 142 134(L)  Potassium 3.5 - 5.1 mmol/L 3.3(L) 3.6 3.7  Chloride 98 - 111 mmol/L 101 106 101  CO2 22 - 32 mmol/L 28 27 25   Calcium 8.9 - 10.3 mg/dL 8.5(L) 9.1 9.1  Total Protein 6.5 - 8.1 g/dL 6.6 - 7.8  Total Bilirubin 0.3 - 1.2 mg/dL 1.4(H) - 1.0  Alkaline Phos 38 -  126 U/L 87 - 83  AST 15 - 41 U/L 55(H) - 21  ALT 0 - 44 U/L 55(H) - 18   Lactate 0.7  Blood culture: NG at 3 days x2 Urine culture: negative  Assessment & Plan: MORISSA OBEIRNE is a 65 y.o. woman with low risk, early stage endometrial cancer who presents for follow-up after recent hospital visit for abdominal pain and fever with ongoing GI symptoms concerning for possible C. difficile infection.  The patient is overall feeling better after the weekend.  She was prescribed 2 antibiotics but it sounds like only one was actually available at her pharmacy (Flagyl) which she has been taking since Saturday.  She has been afebrile or with a low-grade fever at home and otherwise her abdominal pain and overall malaise have been improving.  Based on her description of ongoing diarrhea and odor, I am suspicious that she may have developed C. difficile postop.  We will plan to send a stool sample today to rule out C. difficile or other infection.  If these results are negative, then we will discuss with her the use of antidiarrheal medications to help with symptom control.  Otherwise, exam is overall reassuring as was her imaging and blood work from ED visit on Friday.  20 minutes of total time was spent for this patient encounter, including preparation, face-to-face counseling with the patient and coordination of care, and documentation of the encounter.  Jeral Pinch, MD  Division of Gynecologic Oncology  Department of Obstetrics and Gynecology  [facility]

## 2019-08-10 ENCOUNTER — Telehealth: Payer: Self-pay

## 2019-08-10 NOTE — Telephone Encounter (Signed)
Andrea Eaton states that she has not had any BM since the 3 loose stools yesterday 08-09-19 am.( Pt took imodium ~5 pm Sunday evening 08/08/19 Afebrile. No chills  Continues with Flagyl Abdominal pain 5/10. She is on;y using ibuprofen for pain. She states that she is comfortable with the pain level. She is passing gas. She has had intermittent nausea no vomiting. She is drinking at least 64 oz of fluid a day. Appetite is decreased. Yogart for breakfast today and some slices of [employer]i sliced roast beef and some soup for lunch.

## 2019-08-10 NOTE — Telephone Encounter (Signed)
Information below reviewed with Dr. Berline Lopes. Will call Ms Quist tomorrow to check on her.

## 2019-08-11 ENCOUNTER — Ambulatory Visit: Payer: BC Managed Care – PPO | Admitting: Family Medicine

## 2019-08-11 LAB — CULTURE, BLOOD (ROUTINE X 2)
Culture: NO GROWTH
Culture: NO GROWTH
Special Requests: ADEQUATE
Special Requests: ADEQUATE

## 2019-08-11 LAB — SURGICAL PATHOLOGY

## 2019-08-11 NOTE — Telephone Encounter (Signed)
Ms Broadfoot states that had a soft stool today. Decreased in odor. Afebrile 98.0 She feels better today. Less abdominal pain. She is applying an ice pack to abdomen as well for comfort. She continues with nausea but no vomiting. She ate toast and [place] bacon this am. Continues to push fluids. She was concerned that last evening she was standing and washing dishes and then used the rest room and she saw where she urinated on herself and was not aware. She also has had some increased in bloody discharge. Reviewed above with Dr. Berline Lopes. Continue monitoring symptoms. The bleeding and some incontinence can occur  from the surgery.  Call if bleeding increases like a period. and decreased appetitet is due to surgery and flagyl. Will call her Friday to check on her.

## 2019-08-13 NOTE — Telephone Encounter (Signed)
[name] states that she feel a little better today. Afebrile. Stool mushy . Odor not strong as it was earlier in the week. Pain controlled with current medications. Appetite slightly better today. Nausea decreased. No vomiting. Pt knows to call the office at [phone] if she has any questions or concerns. Notified Dr. Berline Lopes of  The above information.

## 2019-08-24 ENCOUNTER — Other Ambulatory Visit: Payer: Self-pay

## 2019-08-24 ENCOUNTER — Encounter: Payer: Self-pay | Admitting: Gynecologic Oncology

## 2019-08-24 ENCOUNTER — Inpatient Hospital Stay: Payer: BC Managed Care – PPO | Attending: Gynecologic Oncology | Admitting: Gynecologic Oncology

## 2019-08-24 VITALS — BP 139/72 | HR 75 | Temp 98.1°F | Resp 16 | Ht 62.0 in | Wt 245.9 lb

## 2019-08-24 DIAGNOSIS — Z90710 Acquired absence of both cervix and uterus: Secondary | ICD-10-CM | POA: Insufficient documentation

## 2019-08-24 DIAGNOSIS — Z6841 Body Mass Index (BMI) 40.0 and over, adult: Secondary | ICD-10-CM | POA: Insufficient documentation

## 2019-08-24 DIAGNOSIS — R7303 Prediabetes: Secondary | ICD-10-CM | POA: Insufficient documentation

## 2019-08-24 DIAGNOSIS — Z90722 Acquired absence of ovaries, bilateral: Secondary | ICD-10-CM | POA: Diagnosis not present

## 2019-08-24 DIAGNOSIS — I10 Essential (primary) hypertension: Secondary | ICD-10-CM | POA: Insufficient documentation

## 2019-08-24 DIAGNOSIS — Z7189 Other specified counseling: Secondary | ICD-10-CM

## 2019-08-24 DIAGNOSIS — Z79899 Other long term (current) drug therapy: Secondary | ICD-10-CM | POA: Diagnosis not present

## 2019-08-24 DIAGNOSIS — C541 Malignant neoplasm of endometrium: Secondary | ICD-10-CM | POA: Insufficient documentation

## 2019-08-24 NOTE — Patient Instructions (Signed)
Please notify Dr Denman George at phone number [phone] if you notice vaginal bleeding, new pelvic or abdominal pains, bloating, feeling full easy, or a change in bladder or bowel function.   Please contact Dr Serita Grit office (at [phone]) in October to request an appointment with her for January, 2022.  Your cancer was a stage I cancer and no additional treatments are recommended but we do recommend follow-up every 6 months.

## 2019-08-24 NOTE — Progress Notes (Signed)
Gynecologic Oncology Follow-up Note  Chief Complaint:  Chief Complaint  Patient presents with  . Endometrial cancer ([place])    Follow Up    Assessment/Plan:  [name]  is a 64 y.o.  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 S/p robotic staging on 07/29/19. Pathology revealed low risk factors for recurrence, therefore no adjuvant therapy is recommended according to NCCN guidelines.  Surgery complicated by post op diarrhea and fevers  - resolved with outpatient care.  I discussed risk for recurrence and typical symptoms encouraged her to notify us of these should they develop between visits.  I recommend she have follow-up every 6 months for 5 years in accordance with NCCN guidelines. Those visits should include symptom assessment, physical exam and pelvic examination. Pap smears are not indicated or recommended in the routine surveillance of endometrial cancer.  HPI: [name] is a 64 year old P3 who was seen in consultation at the request of Dr Glo Herring for evaluation of endometrial cancer.  The patient reported a 2-year history of postmenopausal bleeding.  She has a primary care doctor whom she sees regularly, Dr. Buelah Manis, but did not mention the symptoms to Dr. Buelah Manis until she saw her in February 2021.  At that time Dr. Buelah Manis performed a Pap test that was normal and referred the patient to Dr. Glo Herring who saw her in March 2021.  Dr. Glo Herring, and OB/GYN then performed a transvaginal ultrasound scan on May 12, 2019 which revealed a uterus measuring 7.9 x 6.3 x 6.7 cm with multiple small fibroids.  The endometrium was 18 mm and complex and thickened.  The right and left ovaries were grossly normal.  She was then taken to the operating room for a D&C procedure on Jun 29, 2019 which revealed complex atypical hyperplasia with foci worrisome for FIGO grade 1 endometrioid carcinoma involving  endometrioid type polyps.  The patient's medical history is significant for morbid obesity.  She has a BMI of 45 kg meters squared.  She has hypertension, and prediabetes with her most recent hemoglobin A1c 5.7 on April 13, 2019.  Her prediabetes is diet controlled.  She has never had major surgery with her only surgery being the D&C procedure.  She has had 3 prior vaginal deliveries including a stillbirth, and 2 live-born children.  Her only prior hospitalization was for upper thigh cellulitis in approximately 2015.  Her past family history is significant for an aunt with cervical cancer.  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  She lives with her husband who is a diabetic and double amputee but fairly independent with activities of daily living at home.  She also lives with her 21 year old grandson.  Interval Hx:  On 07/29/19 she underwent robotic assisted total hysterectomy, BSO, SLN biopsy. Intraoperative findings were significant for a 7cm uterus with anterior fundal fibroid, normal ovaries bilaterally, normal tubes and no suspicious nodes. Surgery was uncomplicated.  Final pathology revealed stage IA grade 1 endometrioid endometrial adenocarcinoma, 0.4cm, no myometrial invasion, no LVSI, no cervical/adnexal or lymph node involvement. MMR normal.  She was determined to have low risk features and no adjuvant therapy was recommended in accordance with NCCN guidelines.  After surgery she developed fever, abdominal pain and diarrhea. She was evaluated in the ED and CT scan was performed which showed small fluid collection (not organized), no metastatic disease, no source for fever. She was treated  empirically with flagyl and improved.   Current Meds:  Outpatient Encounter Medications as of 08/24/2019  Medication Sig  . allopurinol (ZYLOPRIM) 100 MG tablet TAKE 1 TABLET BY MOUTH EVERY DAY (Patient taking differently: Take 100 mg by mouth daily. )  . ibuprofen (ADVIL) 800 MG tablet TAKE 1  TABLET BY MOUTH EVERY 8 HOURS AS NEEDED (Patient taking differently: Take 800 mg by mouth every 8 (eight) hours as needed (pain.). )  . metroNIDAZOLE (FLAGYL) 500 MG tablet Take 1 tablet (500 mg total) by mouth 2 (two) times daily. One po bid x 7 days  . Multiple Vitamin (MULTIVITAMIN WITH MINERALS) TABS tablet Take 1 tablet by mouth daily.  . Potassium 99 MG TABS Take 99 mg by mouth daily.  Marland Kitchen senna-docusate (SENOKOT-S) 8.6-50 MG tablet Take 2 tablets by mouth at bedtime. For AFTER surgery, do not take if having diarrhea  . torsemide (DEMADEX) 20 MG tablet TAKE 1 TABLET (20 MG TOTAL) BY MOUTH 2 (TWO) TIMES DAILY AS NEEDED. (Patient taking differently: Take 20-40 mg by mouth See admin instructions. Take 1 tablet (20 mg) by mouth scheduled in the morning, may take additional tablet in the morning (40 mg) if needed for fluid retention.)  . vitamin C (ASCORBIC ACID) 500 MG tablet Take 500 mg by mouth daily.  . [DISCONTINUED] traMADol (ULTRAM) 50 MG tablet Take 1 tablet (50 mg total) by mouth every 6 (six) hours as needed for severe pain. For AFTER surgery, do not take and drive (Patient not taking: Reported on 08/24/2019)   No facility-administered encounter medications on file as of 08/24/2019.    Allergy: No Known Allergies  Social Hx:   Social History   Socioeconomic History  . Marital status: Married    Spouse name: Not on file  . Number of children: Not on file  . Years of education: Not on file  . Highest education level: Not on file  Occupational History  . Not on file  Tobacco Use  . Smoking status: Never Smoker  . Smokeless tobacco: Never Used  Vaping Use  . Vaping Use: Never used  Substance and Sexual Activity  . Alcohol use: No  . Drug use: No  . Sexual activity: Never  Other Topics Concern  . Not on file  Social History Narrative  . Not on file   Social Determinants of Health   Financial Resource Strain:   . Difficulty of Paying Living Expenses:   Food Insecurity:   .  Worried About Charity fundraiser in the Last Year:   . Arboriculturist in the Last Year:   Transportation Needs:   . Film/video editor (Medical):   Marland Kitchen Lack of Transportation (Non-Medical):   Physical Activity:   . Days of Exercise per Week:   . Minutes of Exercise per Session:   Stress:   . Feeling of Stress :   Social Connections:   . Frequency of Communication with Friends and Family:   . Frequency of Social Gatherings with Friends and Family:   . Attends Religious Services:   . Active Member of Clubs or Organizations:   . Attends Archivist Meetings:   Marland Kitchen Marital Status:   Intimate Partner Violence:   . Fear of Current or Ex-Partner:   . Emotionally Abused:   Marland Kitchen Physically Abused:   . Sexually Abused:     Past Surgical Hx:  Past Surgical History:  Procedure Laterality Date  . COLONOSCOPY  07/2007   Dr. Fuller Plan:  diverticulosis, internal hemorrhoids  . COLONOSCOPY N/A 03/11/2018   Procedure: COLONOSCOPY;  Surgeon: Danie Binder, MD;  [facility];  Service: Endoscopy;  Laterality: N/A;  9:30am  . ESOPHAGOGASTRODUODENOSCOPY  07/2007   Dr. Fuller Plan: H.pylori gastritis  . HYSTEROSCOPY WITH D & C N/A 06/29/2019   Procedure: DILATATION AND CURETTAGE /HYSTEROSCOPY, removal of endometrial polyp;  Surgeon: Jonnie Kind, MD;  Location: [facility];  Service: Gynecology;  Laterality: N/A;  . IR US GUIDE VASC ACCESS RIGHT  05/27/2019  . IR VENIPUNCTURE 87YRS/OLDER BY MD  05/27/2019  . POLYPECTOMY  03/11/2018   Procedure: POLYPECTOMY;  Surgeon: Danie Binder, MD;  [facility]: [facility];  Service: Endoscopy;;  descending colon  . ROBOTIC ASSISTED TOTAL HYSTERECTOMY WITH BILATERAL SALPINGO OOPHERECTOMY Bilateral 07/29/2019   Procedure: XI ROBOTIC ASSISTED TOTAL HYSTERECTOMY WITH BILATERAL SALPINGO OOPHORECTOMY;  Surgeon: Everitt Amber, MD;  Location: [facility];  Service: Gynecology;  Laterality: Bilateral;  . SENTINEL NODE BIOPSY N/A 07/29/2019   Procedure: SENTINEL NODE BIOPSY;   Surgeon: Everitt Amber, MD;  Location: [facility];  Service: Gynecology;  Laterality: N/A;    Past Medical Hx:  Past Medical History:  Diagnosis Date  . Arthritis    Dr. Sharol Given  . Hypertension   . Prediabetes     Past Gynecological History:  See HPI No LMP recorded. Patient is postmenopausal.  Family Hx:  Family History  Problem Relation Age of Onset  . Hypertension Mother   . Diabetes Mother   . Hypertension Father   . Heart disease Father   . Diabetes Sister   . Hypertension Sister   . Hyperlipidemia Sister   . Heart disease Sister   . Hypertension Brother   . Diabetes Brother   . Colon cancer Neg Hx     Review of Systems:  Constitutional  Feels well,    ENT Normal appearing ears and nares bilaterally Skin/Breast  No rash, sores, jaundice, itching, dryness Cardiovascular  No chest pain, shortness of breath, or edema  Pulmonary  No cough or wheeze.  Gastro Intestinal  No nausea, vomitting, or diarrhoea. No bright red blood per rectum, no abdominal pain, change in bowel movement, or constipation.  Genito Urinary  No frequency, urgency, dysuria, no bleeding  Musculo Skeletal  No myalgia, arthralgia, joint swelling or pain  Neurologic  No weakness, numbness, change in gait,  Psychology  No depression, anxiety, insomnia.   Vitals:  Blood pressure 139/72, pulse 75, temperature 98.1 F (36.7 C), temperature source Oral, resp. rate 16, height '5\' 2"'  (1.575 m), weight 245 lb 14.4 oz (111.5 kg), SpO2 100 %.  Physical Exam: WD in NAD Neck  Supple NROM, without any enlargements.  Lymph Node Survey No cervical supraclavicular or inguinal adenopathy Cardiovascular  Pulse normal rate, regularity and rhythm. S1 and S2 normal.  Lungs  Clear to auscultation bilateraly, without wheezes/crackles/rhonchi. Good air movement.  Skin  No rash/lesions/breakdown  Psychiatry  Alert and oriented to person, place, and time  Abdomen  Normoactive bowel sounds, abdomen soft,  non-tender and obese without evidence of hernia. Well healed incisions Back No CVA tenderness Genito Urinary  Vulva/vagina: Normal external female genitalia.  No lesions. No discharge or bleeding.  Bladder/urethra:  No lesions or masses, well supported bladder  Vagina: no masses, lesions, well healed cuff, no drainage, no blood. Rectal  Good tone, no masses no cul de sac nodularity.  Extremities  No bilateral cyanosis, clubbing or edema.   30 minutes of direct face to face  counseling time was spent with the patient. This included discussion about prognosis, therapy recommendations and postoperative side effects and are beyond the scope of routine postoperative care.   Thereasa Solo, MD  08/24/2019, 2:28 PM

## 2019-10-12 ENCOUNTER — Other Ambulatory Visit: Payer: Self-pay | Admitting: Family Medicine

## 2019-10-19 ENCOUNTER — Encounter: Payer: Self-pay | Admitting: Family Medicine

## 2019-10-19 ENCOUNTER — Other Ambulatory Visit: Payer: Self-pay

## 2019-10-19 ENCOUNTER — Ambulatory Visit: Payer: BC Managed Care – PPO | Admitting: Family Medicine

## 2019-10-19 VITALS — BP 136/76 | HR 66 | Temp 98.3°F | Resp 16 | Ht 62.0 in | Wt 235.0 lb

## 2019-10-19 DIAGNOSIS — E669 Obesity, unspecified: Secondary | ICD-10-CM

## 2019-10-19 DIAGNOSIS — R7303 Prediabetes: Secondary | ICD-10-CM

## 2019-10-19 DIAGNOSIS — K5904 Chronic idiopathic constipation: Secondary | ICD-10-CM

## 2019-10-19 DIAGNOSIS — I10 Essential (primary) hypertension: Secondary | ICD-10-CM | POA: Diagnosis not present

## 2019-10-19 MED ORDER — TORSEMIDE 20 MG PO TABS: 20.0000 mg | ORAL_TABLET | Freq: Two times a day (BID) | ORAL | 1 refills | Status: AC | PRN

## 2019-10-19 MED ORDER — POLYETHYLENE GLYCOL 3350 17 GM/SCOOP PO POWD: ORAL | 1 refills | Status: AC

## 2019-10-19 NOTE — Patient Instructions (Signed)
F/U 4 MONTHS We will call with lab results

## 2019-10-19 NOTE — Assessment & Plan Note (Signed)
Pressure is controlled.  She will continue torsemide for peripheral edema as well.  Potassium she did have mildly low potassium at her labs during her surgery.

## 2019-10-19 NOTE — Progress Notes (Signed)
° °  Subjective:    Patient ID: Andrea Eaton, female    DOB: 11/02/1955, 64 y.o.   MRN: [id number]  Patient presents for Follow-up (is not fasting)   Pt here to f/u chronic medical problems    She had robitic surgery for her endometrial cancer She has f/u in 6 months hysterectomy was curative, no adjunt chemo or radiation was needed    she has had constipation, she takes senna kot plus  she has had some fecal urgency using this   No blood in the stool      Dentist pulled tooth last week that was deep, given amoxicilin   Peripheral edema-   OA knee- taking ibuprofen for her knee   Due for repeat BMET/BC   Weight was 249lbs in Feb, today 235lbs  Not snacking asmuch  She does drink protein shakes    Review Of Systems:  GEN- denies fatigue, fever, weight loss,weakness, recent illness HEENT- denies eye drainage, change in vision, nasal discharge, CVS- denies chest pain, palpitations RESP- denies SOB, cough, wheeze ABD- denies N/V, change in stools, abd pain GU- denies dysuria, hematuria, dribbling, incontinence MSK- denies joint pain, muscle aches, injury Neuro- denies headache, dizziness, syncope, seizure activity       Objective:    BP 136/76    Pulse 66    Temp 98.3 F (36.8 C) (Temporal)    Resp 16    Ht 5\' 2"  (1.575 m)    Wt 235 lb (106.6 kg)    SpO2 97%    BMI 42.98 kg/m  GEN- NAD, alert and oriented x3 HEENT- PERRL, EOMI, non injected sclera, pink conjunctiva, MMM, oropharynx clear Neck- Supple, no thyromegaly CVS- RRR, no murmur RESP-CTAB ABD-NABS,soft,NT,ND EXT- pedal  edema Pulses- Radial, DP- 2+        Assessment & Plan:      Problem List Items Addressed This Visit      Unprioritized   Borderline diabetes   Relevant Orders   Hemoglobin A1c   Class 3 obesity    Also improve her BMI below 40 so that she can have knee surgery.  Continue with increasing protein and reducing carbohydrates and snacking. We discussed her walking.  She was walking  before felt good even if she would have any pain afterwards.  She states that she slacked off after the surgery plan to restart this.      Constipation    Restart miralax therapy Increase fiber and water        Essential hypertension - Primary    Pressure is controlled.  She will continue torsemide for peripheral edema as well.  Potassium she did have mildly low potassium at her labs during her surgery.      Relevant Medications   torsemide (DEMADEX) 20 MG tablet   Other Relevant Orders   CBC with Differential/Platelet   Comprehensive metabolic panel      Note: This dictation was prepared with Dragon dictation along with smaller phrase technology. Any transcriptional errors that result from this process are unintentional.

## 2019-10-19 NOTE — Assessment & Plan Note (Signed)
Also improve her BMI below 40 so that she can have knee surgery.  Continue with increasing protein and reducing carbohydrates and snacking. We discussed her walking.  She was walking before felt good even if she would have any pain afterwards.  She states that she slacked off after the surgery plan to restart this.

## 2019-10-19 NOTE — Assessment & Plan Note (Signed)
Restart miralax therapy Increase fiber and water

## 2019-10-20 LAB — CBC WITH DIFFERENTIAL/PLATELET
Absolute Monocytes: 431 cells/uL (ref 200–950)
Basophils Absolute: 39 cells/uL (ref 0–200)
Basophils Relative: 0.7 %
Eosinophils Absolute: 78 cells/uL (ref 15–500)
Eosinophils Relative: 1.4 %
HCT: 38.3 % (ref 35.0–45.0)
Hemoglobin: 13.1 g/dL (ref 11.7–15.5)
Lymphs Abs: 2206 cells/uL (ref 850–3900)
MCH: 29.3 pg (ref 27.0–33.0)
MCHC: 34.2 g/dL (ref 32.0–36.0)
MCV: 85.7 fL (ref 80.0–100.0)
MPV: 10.3 fL (ref 7.5–12.5)
Monocytes Relative: 7.7 %
Neutro Abs: 2845 cells/uL (ref 1500–7800)
Neutrophils Relative %: 50.8 %
Platelets: 255 10*3/uL (ref 140–400)
RBC: 4.47 10*6/uL (ref 3.80–5.10)
RDW: 14.5 % (ref 11.0–15.0)
Total Lymphocyte: 39.4 %
WBC: 5.6 10*3/uL (ref 3.8–10.8)

## 2019-10-20 LAB — COMPREHENSIVE METABOLIC PANEL
AG Ratio: 1.2 (calc) (ref 1.0–2.5)
ALT: 13 U/L (ref 6–29)
AST: 18 U/L (ref 10–35)
Albumin: 3.8 g/dL (ref 3.6–5.1)
Alkaline phosphatase (APISO): 79 U/L (ref 37–153)
BUN: 21 mg/dL (ref 7–25)
CO2: 24 mmol/L (ref 20–32)
Calcium: 9.3 mg/dL (ref 8.6–10.4)
Chloride: 103 mmol/L (ref 98–110)
Creat: 0.82 mg/dL (ref 0.50–0.99)
Globulin: 3.1 g/dL (calc) (ref 1.9–3.7)
Glucose, Bld: 88 mg/dL (ref 65–99)
Potassium: 3.5 mmol/L (ref 3.5–5.3)
Sodium: 140 mmol/L (ref 135–146)
Total Bilirubin: 0.8 mg/dL (ref 0.2–1.2)
Total Protein: 6.9 g/dL (ref 6.1–8.1)

## 2019-10-20 LAB — HEMOGLOBIN A1C
Hgb A1c MFr Bld: 5.5 % of total Hgb (ref <5.7)
Mean Plasma Glucose: 111 (calc)
eAG (mmol/L): 6.2 (calc)

## 2019-12-10 ENCOUNTER — Other Ambulatory Visit: Payer: Self-pay | Admitting: Family Medicine

## 2020-02-21 ENCOUNTER — Ambulatory Visit: Payer: BC Managed Care – PPO | Admitting: Family Medicine

## 2020-06-27 ENCOUNTER — Other Ambulatory Visit: Payer: Self-pay | Admitting: Family Medicine

## 2020-08-23 DIAGNOSIS — R109 Unspecified abdominal pain: Secondary | ICD-10-CM | POA: Diagnosis not present

## 2020-08-23 DIAGNOSIS — R600 Localized edema: Secondary | ICD-10-CM | POA: Diagnosis not present

## 2020-08-23 DIAGNOSIS — M170 Bilateral primary osteoarthritis of knee: Secondary | ICD-10-CM | POA: Diagnosis not present

## 2020-08-23 DIAGNOSIS — R7303 Prediabetes: Secondary | ICD-10-CM | POA: Diagnosis not present

## 2020-08-23 DIAGNOSIS — R030 Elevated blood-pressure reading, without diagnosis of hypertension: Secondary | ICD-10-CM | POA: Diagnosis not present

## 2020-08-23 DIAGNOSIS — M109 Gout, unspecified: Secondary | ICD-10-CM | POA: Diagnosis not present

## 2020-09-20 DIAGNOSIS — R7303 Prediabetes: Secondary | ICD-10-CM | POA: Diagnosis not present

## 2020-09-20 DIAGNOSIS — M109 Gout, unspecified: Secondary | ICD-10-CM | POA: Diagnosis not present

## 2020-09-20 DIAGNOSIS — Z0001 Encounter for general adult medical examination with abnormal findings: Secondary | ICD-10-CM | POA: Diagnosis not present

## 2020-09-27 ENCOUNTER — Other Ambulatory Visit (HOSPITAL_COMMUNITY): Payer: Self-pay | Admitting: Family Medicine

## 2020-09-27 DIAGNOSIS — M109 Gout, unspecified: Secondary | ICD-10-CM | POA: Diagnosis not present

## 2020-09-27 DIAGNOSIS — M170 Bilateral primary osteoarthritis of knee: Secondary | ICD-10-CM | POA: Diagnosis not present

## 2020-09-27 DIAGNOSIS — Z1231 Encounter for screening mammogram for malignant neoplasm of breast: Secondary | ICD-10-CM | POA: Diagnosis not present

## 2020-09-27 DIAGNOSIS — R030 Elevated blood-pressure reading, without diagnosis of hypertension: Secondary | ICD-10-CM | POA: Diagnosis not present

## 2020-09-27 DIAGNOSIS — R7303 Prediabetes: Secondary | ICD-10-CM | POA: Diagnosis not present

## 2020-09-27 DIAGNOSIS — R600 Localized edema: Secondary | ICD-10-CM | POA: Diagnosis not present

## 2020-12-27 ENCOUNTER — Ambulatory Visit (HOSPITAL_COMMUNITY): Payer: BC Managed Care – PPO

## 2020-12-29 ENCOUNTER — Other Ambulatory Visit: Payer: Self-pay | Admitting: Family Medicine

## 2021-02-15 ENCOUNTER — Ambulatory Visit (HOSPITAL_COMMUNITY)
Admission: RE | Admit: 2021-02-15 | Discharge: 2021-02-15 | Disposition: A | Discharge status: Home / Self Care | Payer: Medicare Other | Source: Ambulatory Visit | Attending: Family Medicine | Admitting: Family Medicine

## 2021-02-15 ENCOUNTER — Other Ambulatory Visit: Payer: Self-pay

## 2021-02-15 DIAGNOSIS — Z1231 Encounter for screening mammogram for malignant neoplasm of breast: Secondary | ICD-10-CM

## 2021-03-26 ENCOUNTER — Other Ambulatory Visit: Payer: Self-pay | Admitting: Family Medicine

## 2021-04-06 DIAGNOSIS — R7303 Prediabetes: Secondary | ICD-10-CM | POA: Diagnosis not present

## 2021-04-06 DIAGNOSIS — Z79899 Other long term (current) drug therapy: Secondary | ICD-10-CM | POA: Diagnosis not present

## 2021-05-15 ENCOUNTER — Other Ambulatory Visit (HOSPITAL_COMMUNITY): Payer: Self-pay | Admitting: Family Medicine

## 2021-05-15 DIAGNOSIS — M79604 Pain in right leg: Secondary | ICD-10-CM

## 2021-05-15 DIAGNOSIS — R600 Localized edema: Secondary | ICD-10-CM | POA: Diagnosis not present

## 2021-05-15 DIAGNOSIS — R030 Elevated blood-pressure reading, without diagnosis of hypertension: Secondary | ICD-10-CM | POA: Diagnosis not present

## 2021-05-15 DIAGNOSIS — M109 Gout, unspecified: Secondary | ICD-10-CM | POA: Diagnosis not present

## 2021-05-15 DIAGNOSIS — M170 Bilateral primary osteoarthritis of knee: Secondary | ICD-10-CM | POA: Diagnosis not present

## 2021-05-15 DIAGNOSIS — R7303 Prediabetes: Secondary | ICD-10-CM | POA: Diagnosis not present

## 2021-05-15 DIAGNOSIS — N3281 Overactive bladder: Secondary | ICD-10-CM | POA: Diagnosis not present

## 2021-07-18 IMAGING — DX DG CHEST 1V PORT
1 series · 1 of 1 positions shown · non-contrast
Comparison: Chest radiograph 09/27/2012

CLINICAL DATA: Pt states she had a robotic hysterectomy last week
and began to run a fever today. COVID negative 07/26/19. Hx HTN,
nonsmoker

EXAM:
PORTABLE CHEST 1 VIEW

[chest ap]
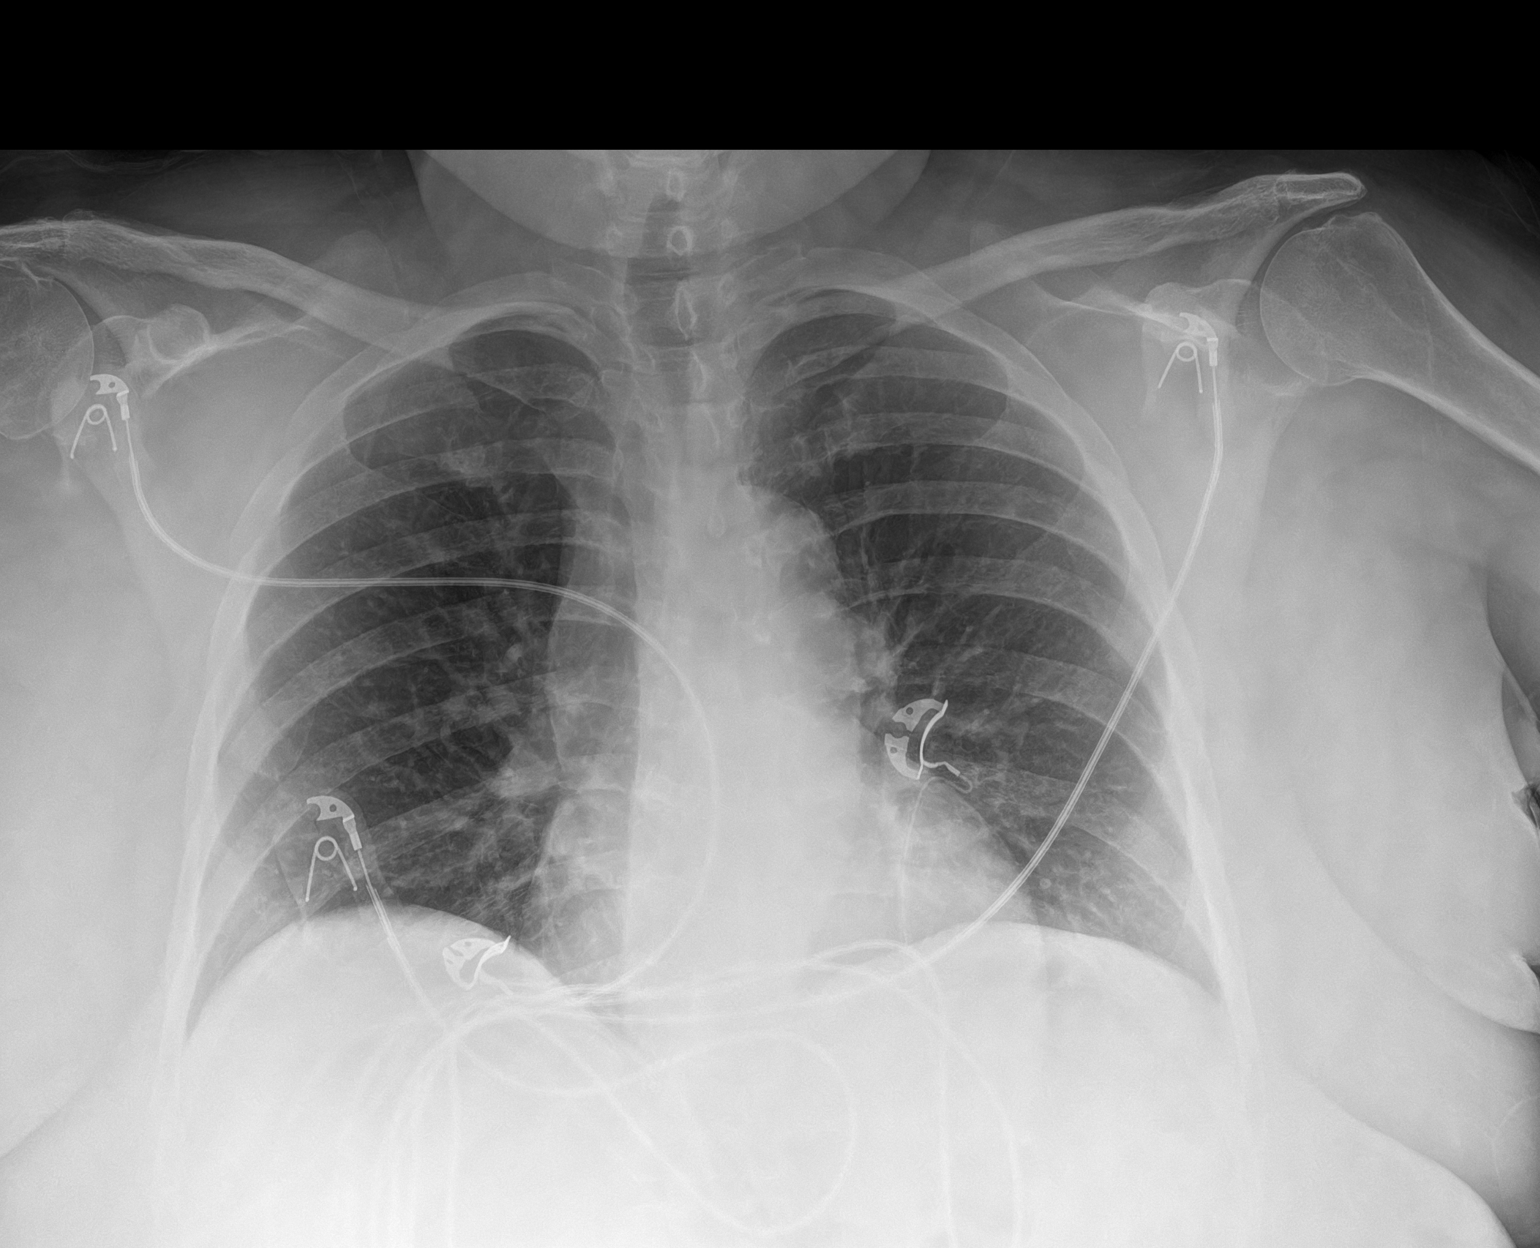

[1 of 1 positions shown; findings below may reference images not displayed]

FINDINGS: Stable cardiomediastinal contours. The lungs are clear. No
pneumothorax or pleural effusion. No acute finding in the visualized
skeleton.
IMPRESSION: No evidence of active disease.

## 2021-07-18 IMAGING — CT CT ABD-PELV W/ CM
2 of 5 series · 15 of 46 positions shown, 17 images · IV contrast (omnipaque)
Comparison: None.

CLINICAL DATA: Sepsis, recent hysterectomy fever

EXAM:
CT ABDOMEN AND PELVIS WITH CONTRAST
TECHNIQUE: Multidetector CT imaging of the abdomen and pelvis was performed
using the standard protocol following bolus administration of
intravenous contrast.
CONTRAST:  100mL OMNIPAQUE IOHEXOL 300 MG/ML  SOLN

[Series 2: axial st · axial · 0.89mm/px · z∈[-557,-142]mm · 12 of 99 slices shown, 14 images]
[im 8/99  soft-tissue]
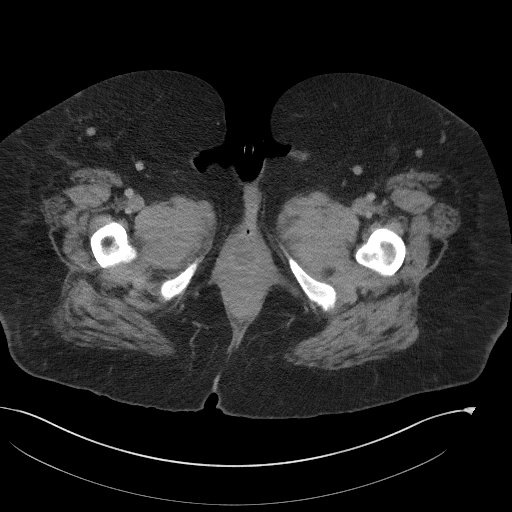
[im 8/99  bone]
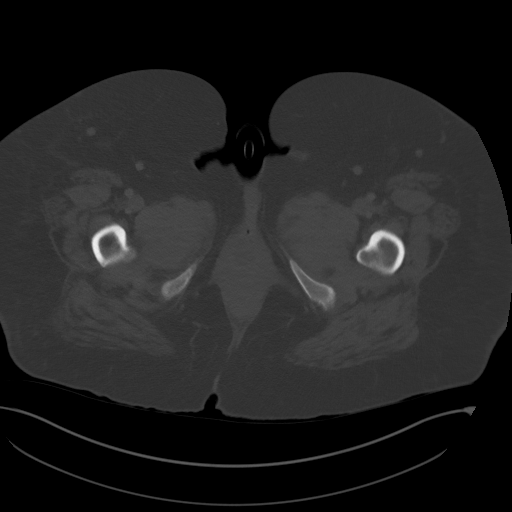
[im 16/99  soft-tissue]
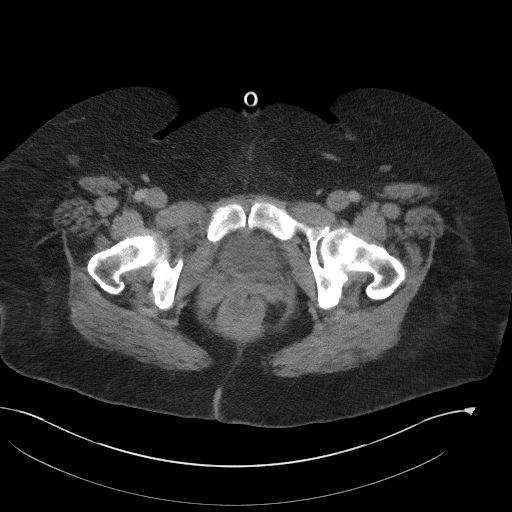
[im 23/99  soft-tissue]
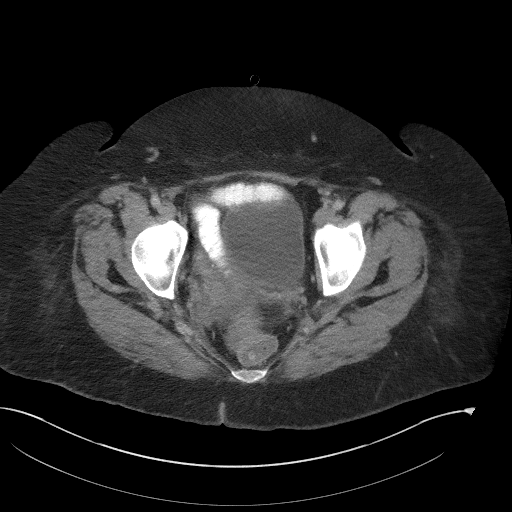
[im 31/99  soft-tissue]
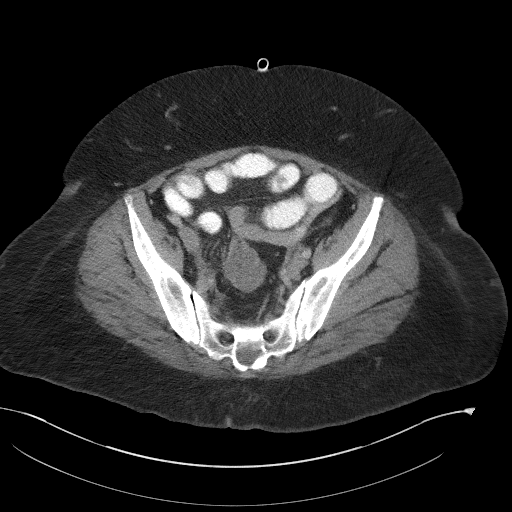
[im 38/99  soft-tissue]
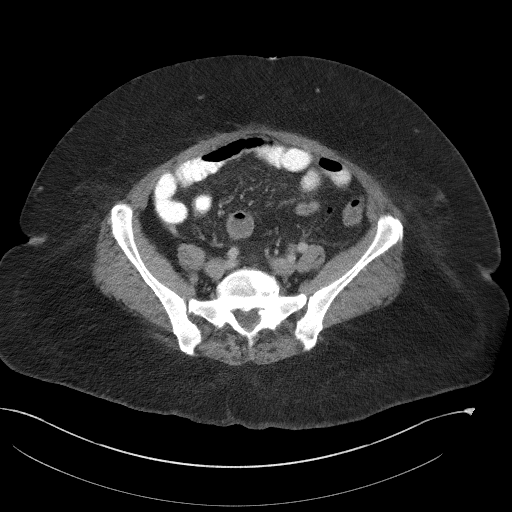
[im 46/99  soft-tissue]
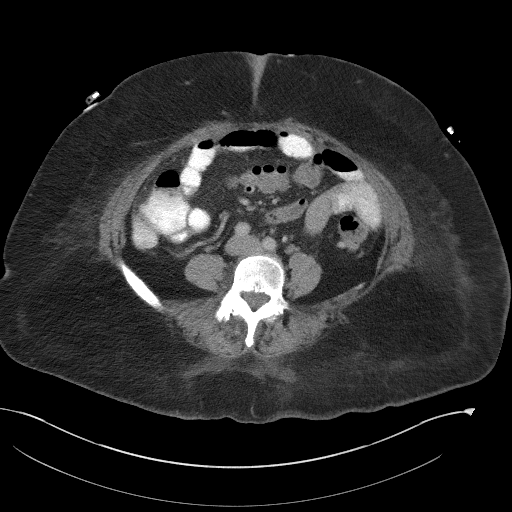
[im 53/99  soft-tissue]
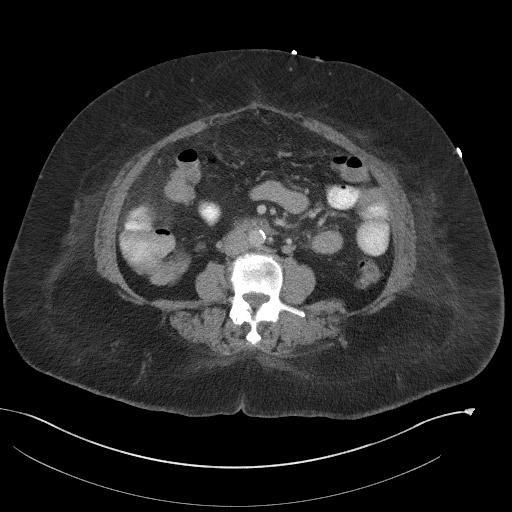
[im 61/99  soft-tissue]
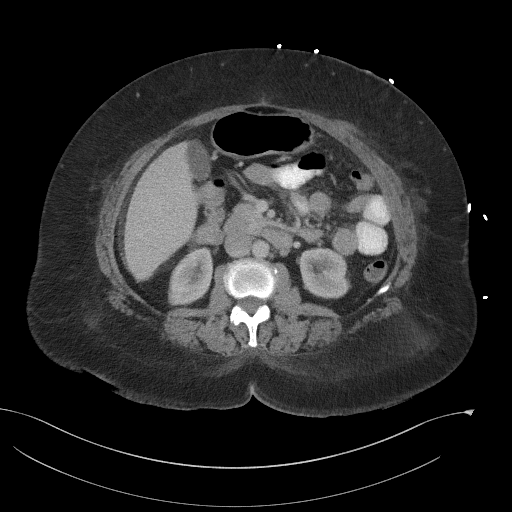
[im 68/99  soft-tissue]
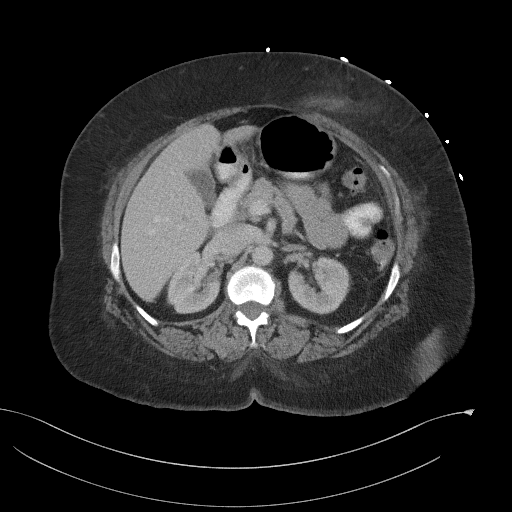
[im 68/99  bone]
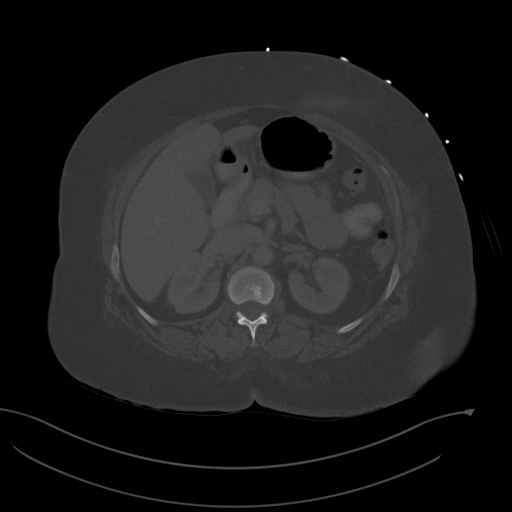
[im 76/99  soft-tissue]
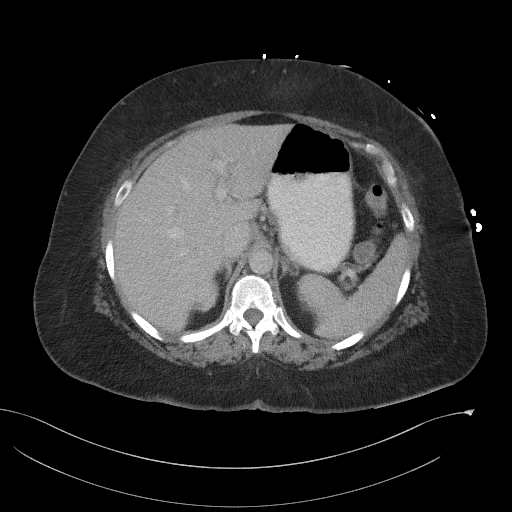
[im 83/99  soft-tissue]
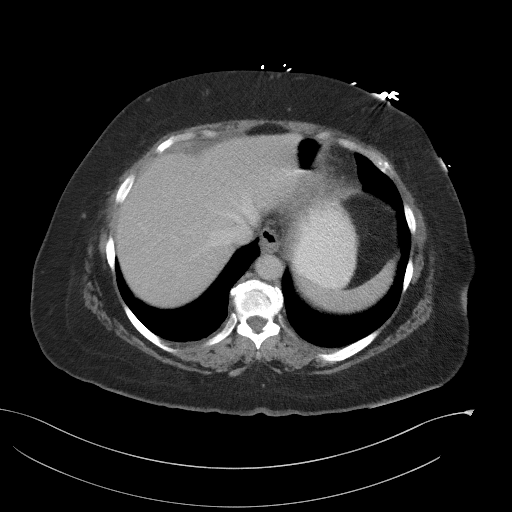
[im 91/99  soft-tissue]
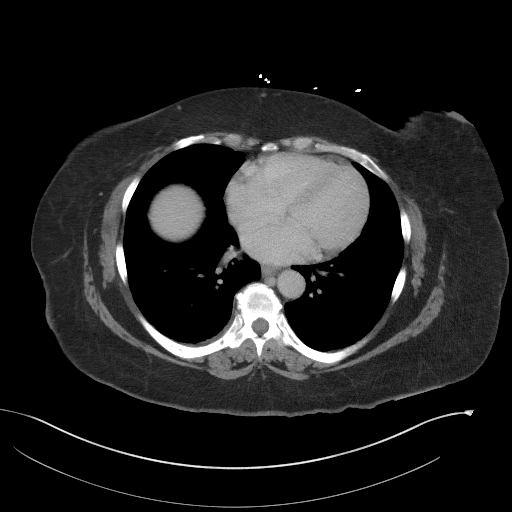

[Series 5: coronal st · coronal · 0.94mm/px · 3 of 135 slices shown]
[im 45/135  soft-tissue]
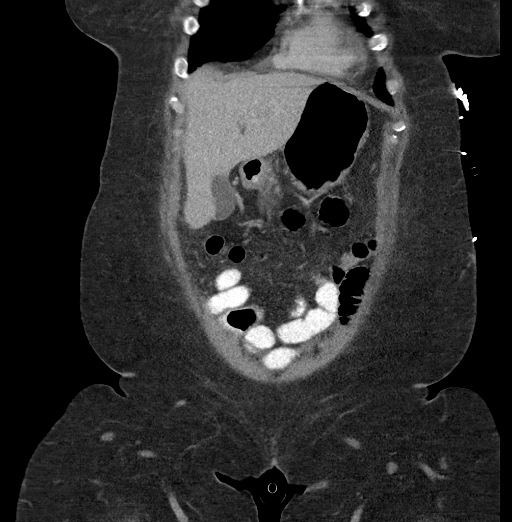
[im 60/135  soft-tissue]
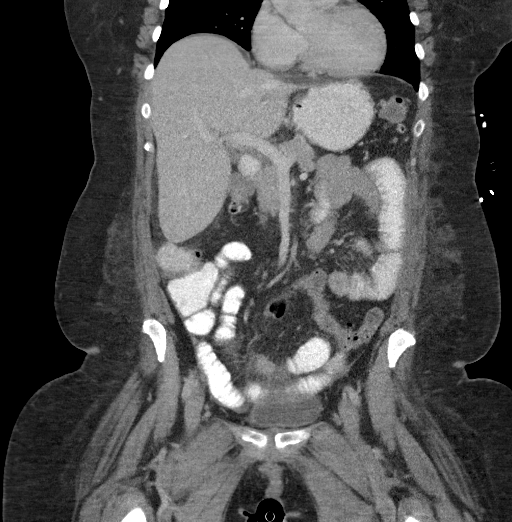
[im 75/135  soft-tissue]
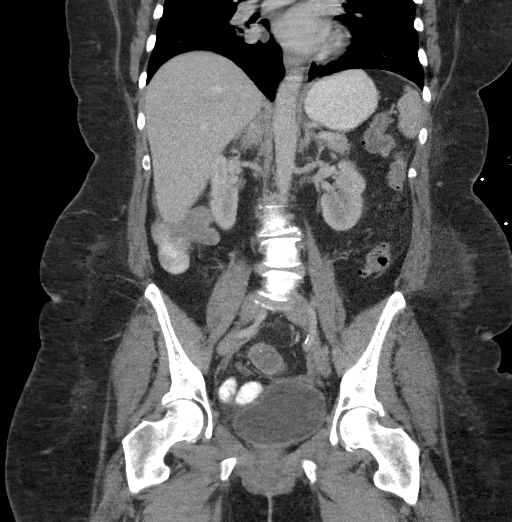

[15 of 46 positions shown; findings below may reference images not displayed]

FINDINGS: Lower chest: Lung bases are clear.

Hepatobiliary: No focal liver abnormality is seen. No gallstones,
gallbladder wall thickening, or biliary dilatation.

Pancreas: Unremarkable. No pancreatic ductal dilatation or
surrounding inflammatory changes.

Spleen: Normal in size without focal abnormality.

Adrenals/Urinary Tract: Adrenal glands are unremarkable. Small cyst
in the right kidney. Adrenal glands are normal. No hydronephrosis.

Stomach/Bowel: Stomach is nonenlarged. No dilated small bowel.
Negative appendix. Scattered sigmoid colon diverticula without acute
inflammatory change. Thickened small bowel within the right
posterior pelvis, likely reactive from recent surgery.

Vascular/Lymphatic: Mild aortic atherosclerosis. No aneurysm. No
suspicious adenopathy.

Reproductive: Status post hysterectomy. No adnexal mass. Minimally
rim enhancing small fluid collection, best seen on coronal views
measuring 3.1 cm, slightly cranial to the vaginal cuff and posterior
to the bladder. No internal gas bubbles.

Other: No free air.

Musculoskeletal: Small fat containing umbilical hernia. Edema within
the subcutaneous fat of the anterior abdominal wall. No acute
osseous abnormality
IMPRESSION: 1. Status post hysterectomy. Small 3.1 cm fluid collection within
the right posterior pelvis, cranial to the vaginal cuff suggestive
of postoperative fluid collection. No internal gas bubbles or
significant inflammatory change to suggest organizing abscess at
this time.
2. Thickened loop of small bowel in the right posterior pelvis
likely reactive to recent surgical change.
3. Scattered diverticular disease of the colon without acute
inflammatory process.

## 2021-11-02 DIAGNOSIS — H524 Presbyopia: Secondary | ICD-10-CM | POA: Diagnosis not present

## 2021-11-16 DIAGNOSIS — R7303 Prediabetes: Secondary | ICD-10-CM | POA: Diagnosis not present

## 2021-11-16 DIAGNOSIS — Z0000 Encounter for general adult medical examination without abnormal findings: Secondary | ICD-10-CM | POA: Diagnosis not present

## 2021-11-16 DIAGNOSIS — Z136 Encounter for screening for cardiovascular disorders: Secondary | ICD-10-CM | POA: Diagnosis not present

## 2021-11-21 DIAGNOSIS — R600 Localized edema: Secondary | ICD-10-CM | POA: Diagnosis not present

## 2021-11-21 DIAGNOSIS — N3281 Overactive bladder: Secondary | ICD-10-CM | POA: Diagnosis not present

## 2021-11-21 DIAGNOSIS — M170 Bilateral primary osteoarthritis of knee: Secondary | ICD-10-CM | POA: Diagnosis not present

## 2021-11-21 DIAGNOSIS — Z0000 Encounter for general adult medical examination without abnormal findings: Secondary | ICD-10-CM | POA: Diagnosis not present

## 2021-11-21 DIAGNOSIS — H6122 Impacted cerumen, left ear: Secondary | ICD-10-CM | POA: Diagnosis not present

## 2021-11-21 DIAGNOSIS — R030 Elevated blood-pressure reading, without diagnosis of hypertension: Secondary | ICD-10-CM | POA: Diagnosis not present

## 2021-11-21 DIAGNOSIS — Z0001 Encounter for general adult medical examination with abnormal findings: Secondary | ICD-10-CM | POA: Diagnosis not present

## 2021-11-21 DIAGNOSIS — E870 Hyperosmolality and hypernatremia: Secondary | ICD-10-CM | POA: Diagnosis not present

## 2021-11-21 DIAGNOSIS — M109 Gout, unspecified: Secondary | ICD-10-CM | POA: Diagnosis not present

## 2021-11-21 DIAGNOSIS — Z23 Encounter for immunization: Secondary | ICD-10-CM | POA: Diagnosis not present

## 2021-11-21 DIAGNOSIS — R7303 Prediabetes: Secondary | ICD-10-CM | POA: Diagnosis not present

## 2022-09-10 DIAGNOSIS — Z6841 Body Mass Index (BMI) 40.0 and over, adult: Secondary | ICD-10-CM | POA: Diagnosis not present

## 2022-09-10 DIAGNOSIS — Z20822 Contact with and (suspected) exposure to covid-19: Secondary | ICD-10-CM | POA: Diagnosis not present

## 2022-09-10 DIAGNOSIS — R030 Elevated blood-pressure reading, without diagnosis of hypertension: Secondary | ICD-10-CM | POA: Diagnosis not present

## 2022-11-18 DIAGNOSIS — M109 Gout, unspecified: Secondary | ICD-10-CM | POA: Diagnosis not present

## 2022-11-18 DIAGNOSIS — E782 Mixed hyperlipidemia: Secondary | ICD-10-CM | POA: Diagnosis not present

## 2022-11-18 DIAGNOSIS — R7303 Prediabetes: Secondary | ICD-10-CM | POA: Diagnosis not present

## 2022-11-22 DIAGNOSIS — H35033 Hypertensive retinopathy, bilateral: Secondary | ICD-10-CM | POA: Diagnosis not present

## 2022-11-27 ENCOUNTER — Other Ambulatory Visit (HOSPITAL_COMMUNITY): Payer: Self-pay | Admitting: Family Medicine

## 2022-11-27 DIAGNOSIS — R600 Localized edema: Secondary | ICD-10-CM | POA: Diagnosis not present

## 2022-11-27 DIAGNOSIS — M545 Low back pain, unspecified: Secondary | ICD-10-CM

## 2022-11-27 DIAGNOSIS — G8929 Other chronic pain: Secondary | ICD-10-CM | POA: Diagnosis not present

## 2022-11-27 DIAGNOSIS — Z1231 Encounter for screening mammogram for malignant neoplasm of breast: Secondary | ICD-10-CM

## 2022-11-27 DIAGNOSIS — R7303 Prediabetes: Secondary | ICD-10-CM | POA: Diagnosis not present

## 2022-11-27 DIAGNOSIS — Z0000 Encounter for general adult medical examination without abnormal findings: Secondary | ICD-10-CM | POA: Diagnosis not present

## 2022-11-27 DIAGNOSIS — M170 Bilateral primary osteoarthritis of knee: Secondary | ICD-10-CM | POA: Diagnosis not present

## 2022-11-27 DIAGNOSIS — E870 Hyperosmolality and hypernatremia: Secondary | ICD-10-CM | POA: Diagnosis not present

## 2022-11-27 DIAGNOSIS — M109 Gout, unspecified: Secondary | ICD-10-CM | POA: Diagnosis not present

## 2022-11-27 DIAGNOSIS — N3281 Overactive bladder: Secondary | ICD-10-CM | POA: Diagnosis not present

## 2022-12-03 ENCOUNTER — Ambulatory Visit (HOSPITAL_COMMUNITY)
Admission: RE | Admit: 2022-12-03 | Discharge: 2022-12-03 | Disposition: A | Discharge status: Home / Self Care | Payer: Medicare PPO | Source: Ambulatory Visit | Attending: Family Medicine | Admitting: Family Medicine

## 2022-12-03 DIAGNOSIS — M549 Dorsalgia, unspecified: Secondary | ICD-10-CM | POA: Diagnosis not present

## 2022-12-03 DIAGNOSIS — M47816 Spondylosis without myelopathy or radiculopathy, lumbar region: Secondary | ICD-10-CM | POA: Diagnosis not present

## 2022-12-03 DIAGNOSIS — M47814 Spondylosis without myelopathy or radiculopathy, thoracic region: Secondary | ICD-10-CM | POA: Diagnosis not present

## 2022-12-03 DIAGNOSIS — M4316 Spondylolisthesis, lumbar region: Secondary | ICD-10-CM | POA: Diagnosis not present

## 2022-12-03 DIAGNOSIS — M48061 Spinal stenosis, lumbar region without neurogenic claudication: Secondary | ICD-10-CM | POA: Diagnosis not present

## 2022-12-03 DIAGNOSIS — M4186 Other forms of scoliosis, lumbar region: Secondary | ICD-10-CM | POA: Diagnosis not present

## 2022-12-03 DIAGNOSIS — M545 Low back pain, unspecified: Secondary | ICD-10-CM | POA: Insufficient documentation

## 2022-12-03 DIAGNOSIS — M419 Scoliosis, unspecified: Secondary | ICD-10-CM | POA: Diagnosis not present

## 2023-01-07 ENCOUNTER — Encounter: Payer: Self-pay | Admitting: Orthopedic Surgery

## 2023-01-07 ENCOUNTER — Ambulatory Visit (INDEPENDENT_AMBULATORY_CARE_PROVIDER_SITE_OTHER): Payer: Medicare PPO | Admitting: Orthopedic Surgery

## 2023-01-07 VITALS — BP 126/83 | HR 70 | Ht 62.0 in | Wt 236.0 lb

## 2023-01-07 DIAGNOSIS — M545 Low back pain, unspecified: Secondary | ICD-10-CM | POA: Diagnosis not present

## 2023-01-07 DIAGNOSIS — G8929 Other chronic pain: Secondary | ICD-10-CM

## 2023-01-07 NOTE — Progress Notes (Signed)
New Patient Visit  Assessment: Andrea Eaton is a 67 y.o. female with the following: 1. Chronic bilateral low back pain without sciatica  Plan: Andrea Eaton has pain in her lower back.  Pain has been there for several months.  Radiographs demonstrate diffuse degenerative changes, with near complete loss of disc height at several levels.  Trace anterolisthesis at L4-5.  These findings were reviewed with the patient.  She will continue to take medications as needed.  She is not interested in formal physical therapy, would like to try some exercises on her own.  Injections are a possibility in the future.  She is aware that we would have to refer her to [facility] for further consideration.  If she does not continue to get better, she will return to clinic.  Follow-up as needed.   Follow-up: Return if symptoms worsen or fail to improve.  Subjective:  Chief Complaint  Patient presents with   Back Pain    LBP w/ radiation down R leg to back of knee fir 3 mos.     History of Present Illness: Andrea Eaton is a 67 y.o. female who has been referred by Andrea Payor, FNP for evaluation of low back pain.  She states that she has had pain in her lower back for at least 6 months.  No specific injury.  She has occasional radiating symptoms into the right leg.  These do not extend past the knees.  Occasional numbness and tingling in bilateral feet.  Medications have not been effective at improving her pain.  She has not worked with therapy.  No specific home exercises.   Review of Systems: No fevers or chills Occasional numbness or tingling No chest pain No shortness of breath No bowel or bladder dysfunction No GI distress No headaches   Medical History:  Past Medical History:  Diagnosis Date   Arthritis    Dr. Lajoyce Corners   Hypertension    Prediabetes     Past Surgical History:  Procedure Laterality Date   COLONOSCOPY  07/2007   Dr. Russella Dar: diverticulosis, internal hemorrhoids    COLONOSCOPY N/A 03/11/2018   Procedure: COLONOSCOPY;  Surgeon: West Bali, MD;  [facility];  Service: Endoscopy;  Laterality: N/A;  9:30am   ESOPHAGOGASTRODUODENOSCOPY  07/2007   Dr. Russella Dar: H.pylori gastritis   HYSTEROSCOPY WITH D & C N/A 06/29/2019   Procedure: DILATATION AND CURETTAGE /HYSTEROSCOPY, removal of endometrial polyp;  Surgeon: Tilda Burrow, MD;  Location: [facility];  Service: Gynecology;  Laterality: N/A;   IR US GUIDE VASC ACCESS RIGHT  05/27/2019   IR VENIPUNCTURE 39YRS/OLDER BY MD  05/27/2019   POLYPECTOMY  03/11/2018   Procedure: POLYPECTOMY;  Surgeon: West Bali, MD;  [facility];  Service: Endoscopy;;  descending colon   ROBOTIC ASSISTED TOTAL HYSTERECTOMY WITH BILATERAL SALPINGO OOPHERECTOMY Bilateral 07/29/2019   Procedure: XI ROBOTIC ASSISTED TOTAL HYSTERECTOMY WITH BILATERAL SALPINGO OOPHORECTOMY;  Surgeon: Adolphus Birchwood, MD;  Location: [facility];  Service: Gynecology;  Laterality: Bilateral;   SENTINEL NODE BIOPSY N/A 07/29/2019   Procedure: SENTINEL NODE BIOPSY;  Surgeon: Adolphus Birchwood, MD;  Location: [facility];  Service: Gynecology;  Laterality: N/A;    Family History  Problem Relation Age of Onset   Hypertension Mother    Diabetes Mother    Hypertension Father    Heart disease Father    Diabetes Sister    Hypertension Sister    Hyperlipidemia Sister    Heart disease Sister  Hypertension Brother    Diabetes Brother    Colon cancer Neg Hx    Social History   Tobacco Use   Smoking status: Never   Smokeless tobacco: Never  Vaping Use   Vaping status: Never Used  Substance Use Topics   Alcohol use: No   Drug use: No    No Known Allergies  Current Meds  Medication Sig   allopurinol (ZYLOPRIM) 100 MG tablet TAKE 1 TABLET BY MOUTH EVERY DAY   ibuprofen (ADVIL) 800 MG tablet TAKE 1 TABLET BY MOUTH EVERY 8 HOURS AS NEEDED   mirabegron ER (MYRBETRIQ) 25 MG TB24 tablet Take 25 mg by mouth daily.   Multiple Vitamin (MULTIVITAMIN WITH  MINERALS) TABS tablet Take 1 tablet by mouth daily.   polyethylene glycol powder (GLYCOLAX/MIRALAX) 17 GM/SCOOP powder 1 cap full daily for constipation   Potassium 99 MG TABS Take 99 mg by mouth daily.   TART CHERRY PO Take by mouth.   torsemide (DEMADEX) 20 MG tablet Take 1 tablet (20 mg total) by mouth 2 (two) times daily as needed.   vitamin C (ASCORBIC ACID) 500 MG tablet Take 500 mg by mouth daily.    Objective: BP 126/83   Pulse 70   Ht 5\' 2"  (1.575 m)   Wt 236 lb (107 kg)   BMI 43.16 kg/m   Physical Exam:  General: Alert and oriented. and No acute distress. Gait: Slow, steady gait.  Evaluation of the lower back demonstrates no deformity.  No bruising.  Mild tenderness to palpation.  Negative straight leg raise bilaterally.  She has excellent strength in bilateral lower extremities.  Sensation is intact throughout bilateral lower extremities.  1+ patellar tendon reflexes bilaterally.   IMAGING: I personally reviewed images previously obtained in clinic  X-rays were previously obtained.  Diffuse degenerative changes, with trace anterolisthesis at L4-5.   New Medications:  No orders of the defined types were placed in this encounter.     Oliver Barre, MD  01/07/2023 10:27 PM

## 2023-01-07 NOTE — Patient Instructions (Signed)

## 2023-05-21 DIAGNOSIS — R7303 Prediabetes: Secondary | ICD-10-CM | POA: Diagnosis not present

## 2023-05-21 DIAGNOSIS — M109 Gout, unspecified: Secondary | ICD-10-CM | POA: Diagnosis not present

## 2023-05-27 DIAGNOSIS — M109 Gout, unspecified: Secondary | ICD-10-CM | POA: Diagnosis not present

## 2023-05-27 DIAGNOSIS — N3281 Overactive bladder: Secondary | ICD-10-CM | POA: Diagnosis not present

## 2023-05-27 DIAGNOSIS — Z0001 Encounter for general adult medical examination with abnormal findings: Secondary | ICD-10-CM | POA: Diagnosis not present

## 2023-05-27 DIAGNOSIS — R7303 Prediabetes: Secondary | ICD-10-CM | POA: Diagnosis not present

## 2023-05-27 DIAGNOSIS — R600 Localized edema: Secondary | ICD-10-CM | POA: Diagnosis not present

## 2023-05-27 DIAGNOSIS — Z0000 Encounter for general adult medical examination without abnormal findings: Secondary | ICD-10-CM | POA: Diagnosis not present

## 2023-05-27 DIAGNOSIS — M170 Bilateral primary osteoarthritis of knee: Secondary | ICD-10-CM | POA: Diagnosis not present

## 2023-05-27 DIAGNOSIS — E870 Hyperosmolality and hypernatremia: Secondary | ICD-10-CM | POA: Diagnosis not present

## 2023-05-27 DIAGNOSIS — Z8542 Personal history of malignant neoplasm of other parts of uterus: Secondary | ICD-10-CM | POA: Diagnosis not present

## 2023-09-01 ENCOUNTER — Other Ambulatory Visit (HOSPITAL_COMMUNITY)
Admission: RE | Admit: 2023-09-01 | Discharge: 2023-09-01 | Disposition: A | Discharge status: Home / Self Care | Source: Ambulatory Visit | Attending: Obstetrics & Gynecology | Admitting: Obstetrics & Gynecology

## 2023-09-01 ENCOUNTER — Ambulatory Visit: Admitting: Obstetrics & Gynecology

## 2023-09-01 ENCOUNTER — Encounter: Payer: Self-pay | Admitting: Obstetrics & Gynecology

## 2023-09-01 VITALS — BP 137/85 | HR 80 | Ht 62.0 in | Wt 243.0 lb

## 2023-09-01 DIAGNOSIS — Z01419 Encounter for gynecological examination (general) (routine) without abnormal findings: Secondary | ICD-10-CM | POA: Insufficient documentation

## 2023-09-01 DIAGNOSIS — Z1151 Encounter for screening for human papillomavirus (HPV): Secondary | ICD-10-CM | POA: Insufficient documentation

## 2023-09-01 DIAGNOSIS — Z8542 Personal history of malignant neoplasm of other parts of uterus: Secondary | ICD-10-CM

## 2023-09-01 NOTE — Progress Notes (Signed)
 Subjective:     Andrea Eaton is a 68 y.o. female here for a routine exam.  No LMP recorded. Patient has had a hysterectomy. [id number] Birth Control Method:  hysterectomy Stage !a Grade 1 endometrial cancer Menstrual Calendar(currently): na  Current complaints: none.   Current acute medical issues:     Recent Gynecologic History No LMP recorded. Patient has had a hysterectomy. Last Pap: 2021,  normal Last mammogram: 2022,  normal  Past Medical History:  Diagnosis Date   Arthritis    Dr. Harden   Hypertension    Prediabetes     Past Surgical History:  Procedure Laterality Date   COLONOSCOPY  07/2007   Dr. Aneita: diverticulosis, internal hemorrhoids   COLONOSCOPY N/A 03/11/2018   Procedure: COLONOSCOPY;  Surgeon: Harvey Margo CROME, MD;  Location: [facility];  Service: Endoscopy;  Laterality: N/A;  9:30am   ESOPHAGOGASTRODUODENOSCOPY  07/2007   Dr. Aneita: H.pylori gastritis   HYSTEROSCOPY WITH D & C N/A 06/29/2019   Procedure: DILATATION AND CURETTAGE /HYSTEROSCOPY, removal of endometrial polyp;  Surgeon: Edsel Norleen GAILS, MD;  Location: [facility];  Service: Gynecology;  Laterality: N/A;   IR US  GUIDE VASC ACCESS RIGHT  05/27/2019   IR VENIPUNCTURE 57YRS/OLDER BY MD  05/27/2019   POLYPECTOMY  03/11/2018   Procedure: POLYPECTOMY;  Surgeon: Harvey Margo CROME, MD;  Location: [facility];  Service: Endoscopy;;  descending colon   ROBOTIC ASSISTED TOTAL HYSTERECTOMY WITH BILATERAL SALPINGO OOPHERECTOMY Bilateral 07/29/2019   Procedure: XI ROBOTIC ASSISTED TOTAL HYSTERECTOMY WITH BILATERAL SALPINGO OOPHORECTOMY;  Surgeon: Eloy Herring, MD;  Location: [facility];  Service: Gynecology;  Laterality: Bilateral;   SENTINEL NODE BIOPSY N/A 07/29/2019   Procedure: SENTINEL NODE BIOPSY;  Surgeon: Eloy Herring, MD;  Location: [facility];  Service: Gynecology;  Laterality: N/A;    OB History     Gravida  3   Para      Term      Preterm      AB  1   Living  3      SAB  1   IAB      Ectopic       Multiple      Live Births  2           Social History   Socioeconomic History   Marital status: Married    Spouse name: Not on file   Number of children: Not on file   Years of education: Not on file   Highest education level: Not on file  Occupational History   Not on file  Tobacco Use   Smoking status: Never   Smokeless tobacco: Never  Vaping Use   Vaping status: Never Used  Substance and Sexual Activity   Alcohol use: No   Drug use: No   Sexual activity: Never  Other Topics Concern   Not on file  Social History Narrative   Not on file   Social Drivers of Health   Financial Resource Strain: Low Risk  (09/01/2023)   Overall Financial Resource Strain (CARDIA)    Difficulty of Paying Living Expenses: Not very hard  Food Insecurity: No Food Insecurity (09/01/2023)   Hunger Vital Sign    Worried About Running Out of Food in the Last Year: Never true    Ran Out of Food in the Last Year: Never true  Transportation Needs: No Transportation Needs (09/01/2023)   PRAPARE - Transportation    Lack of Transportation (Medical): No    Lack  of Transportation (Non-Medical): No  Physical Activity: Inactive (09/01/2023)   Exercise Vital Sign    Days of Exercise per Week: 0 days    Minutes of Exercise per Session: 10 min  Stress: No Stress Concern Present (09/01/2023)   [employer] of Occupational Health - Occupational Stress Questionnaire    Feeling of Stress: Only a little  Social Connections: Socially Integrated (09/01/2023)   Social Connection and Isolation Panel    Frequency of Communication with Friends and Family: More than three times a week    Frequency of Social Gatherings with Friends and Family: Three times a week    Attends Religious Services: More than 4 times per year    Active Member of Clubs or Organizations: Yes    Attends Engineer, structural: More than 4 times per year    Marital Status: Married    Family History  Problem Relation Age of Onset    Hypertension Father    Heart disease Father    Hypertension Mother    Diabetes Mother    Hypertension Brother    Diabetes Brother    Diabetes Sister    Hypertension Sister    Hyperlipidemia Sister    Heart disease Sister    Colon cancer Neg Hx      Current Outpatient Medications:    allopurinol  (ZYLOPRIM ) 100 MG tablet, TAKE 1 TABLET BY MOUTH EVERY DAY, Disp: 90 tablet, Rfl: 0   ibuprofen  (ADVIL ) 800 MG tablet, TAKE 1 TABLET BY MOUTH EVERY 8 HOURS AS NEEDED, Disp: 30 tablet, Rfl: 1   mirabegron ER (MYRBETRIQ) 25 MG TB24 tablet, Take 25 mg by mouth daily., Disp: , Rfl:    Multiple Vitamin (MULTIVITAMIN WITH MINERALS) TABS tablet, Take 1 tablet by mouth daily., Disp: , Rfl:    polyethylene glycol powder (GLYCOLAX /MIRALAX ) 17 GM/SCOOP powder, 1 cap full daily for constipation, Disp: 3350 g, Rfl: 1   Potassium 99 MG TABS, Take 99 mg by mouth daily., Disp: , Rfl:    TART CHERRY PO, Take by mouth., Disp: , Rfl:    torsemide  (DEMADEX ) 20 MG tablet, Take 1 tablet (20 mg total) by mouth 2 (two) times daily as needed., Disp: 180 tablet, Rfl: 1   vitamin C (ASCORBIC ACID) 500 MG tablet, Take 500 mg by mouth daily., Disp: , Rfl:   Review of Systems  Review of Systems  Constitutional: Negative for fever, chills, weight loss, malaise/fatigue and diaphoresis.  HENT: Negative for hearing loss, ear pain, nosebleeds, congestion, sore throat, neck pain, tinnitus and ear discharge.   Eyes: Negative for blurred vision, double vision, photophobia, pain, discharge and redness.  Respiratory: Negative for cough, hemoptysis, sputum production, shortness of breath, wheezing and stridor.   Cardiovascular: Negative for chest pain, palpitations, orthopnea, claudication, leg swelling and PND.  Gastrointestinal: negative for abdominal pain. Negative for heartburn, nausea, vomiting, diarrhea, constipation, blood in stool and melena.  Genitourinary: Negative for dysuria, urgency, frequency, hematuria and flank  pain.  Musculoskeletal: Negative for myalgias, back pain, joint pain and falls.  Skin: Negative for itching and rash.  Neurological: Negative for dizziness, tingling, tremors, sensory change, speech change, focal weakness, seizures, loss of consciousness, weakness and headaches.  Endo/Heme/Allergies: Negative for environmental allergies and polydipsia. Does not bruise/bleed easily.  Psychiatric/Behavioral: Negative for depression, suicidal ideas, hallucinations, memory loss and substance abuse. The patient is not nervous/anxious and does not have insomnia.        Objective:  Blood pressure 137/85, pulse 80, height 5' 2 (1.575 m), weight  243 lb (110.2 kg).   Physical Exam  Vitals reviewed. Constitutional: She is oriented to person, place, and time. She appears well-developed and well-nourished.  HENT:  Head: Normocephalic and atraumatic.        Right Ear: External ear normal.  Left Ear: External ear normal.  Nose: Nose normal.  Mouth/Throat: Oropharynx is clear and moist.  Eyes: Conjunctivae and EOM are normal. Pupils are equal, round, and reactive to light. Right eye exhibits no discharge. Left eye exhibits no discharge. No scleral icterus.  Neck: Normal range of motion. Neck supple. No tracheal deviation present. No thyromegaly present.  Cardiovascular: Normal rate, regular rhythm, normal heart sounds and intact distal pulses.  Exam reveals no gallop and no friction rub.   No murmur heard. Respiratory: Effort normal and breath sounds normal. No respiratory distress. She has no wheezes. She has no rales. She exhibits no tenderness.  GI: Soft. Bowel sounds are normal. She exhibits no distension and no mass. There is no tenderness. There is no rebound and no guarding.  Genitourinary:  Breasts no masses skin changes or nipple changes bilaterally      Vulva is normal without lesions Vagina is pink moist without discharge cuff intact cytology sample done Cervix absent Uterus is  absent Adnexa is negative   Musculoskeletal: Normal range of motion. She exhibits no edema and no tenderness.  Neurological: She is alert and oriented to person, place, and time. She has normal reflexes. She displays normal reflexes. No cranial nerve deficit. She exhibits normal muscle tone. Coordination normal.  Skin: Skin is warm and dry. No rash noted. No erythema. No pallor.  Psychiatric: She has a normal mood and affect. Her behavior is normal. Judgment and thought content normal.       Medications Ordered at today's visit: No orders of the defined types were placed in this encounter.   Other orders placed at today's visit: No orders of the defined types were placed in this encounter.    ASSESSMENT + PLAN:    ICD-10-CM   1. Well woman exam with routine gynecological exam  Z01.419     2. Encounter for gynecological examination with Papanicolaou smear of cervix  Z01.419 Cytology - PAP( [place])    3. History of endometrial cancer 2021 s/p surgical management  Z85.42           Return in about 1 year (around 08/31/2024) for yearly.

## 2023-09-08 LAB — CYTOLOGY - PAP
Comment: NEGATIVE
Diagnosis: NEGATIVE
High risk HPV: NEGATIVE

## 2024-01-28 ENCOUNTER — Other Ambulatory Visit (HOSPITAL_COMMUNITY): Payer: Self-pay | Admitting: Occupational Therapy
